# Patient Record
Sex: Male | Born: 1965 | Race: White | Hispanic: No
Health system: Southern US, Community
[De-identification: ages and names within clinical notes are randomized; demographics above are authoritative.]

## PROBLEM LIST (undated history)

## (undated) DIAGNOSIS — K269 Duodenal ulcer, unspecified as acute or chronic, without hemorrhage or perforation: Secondary | ICD-10-CM

## (undated) DIAGNOSIS — R0789 Other chest pain: Secondary | ICD-10-CM

## (undated) DIAGNOSIS — F419 Anxiety disorder, unspecified: Secondary | ICD-10-CM

## (undated) DIAGNOSIS — I10 Essential (primary) hypertension: Secondary | ICD-10-CM

## (undated) DIAGNOSIS — F4310 Post-traumatic stress disorder, unspecified: Secondary | ICD-10-CM

---

## 2013-08-01 DIAGNOSIS — A09 Infectious gastroenteritis and colitis, unspecified: Secondary | ICD-10-CM

## 2013-08-01 NOTE — ED Notes (Signed)
Pt given oral contrast.

## 2013-08-01 NOTE — ED Notes (Signed)
Bedside and Verbal shift change report given to Clydell HakimNoel T., RN (oncoming nurse) by Clarise CruzMarie A Clary, RN (offgoing nurse). Report included the following information SBAR, ED Summary and Recent Results.

## 2013-08-01 NOTE — ED Notes (Signed)
Pt laying in bed w/ visitor at bedside.

## 2013-08-01 NOTE — ED Notes (Addendum)
Patient present to ED c/o N/V/D and abdominal pain since Monday. Reports N/V/D. Recieves dialysis MWF. Got dialysis today. Bowel sound active. Left upper arm dialysis access has positive bruit and thrill. Patient lying in bed; wife at bedside.

## 2013-08-01 NOTE — ED Provider Notes (Signed)
Patient is a 48 y.o. male presenting with abdominal pain. The history is provided by the patient.   Abdominal Pain   This is a new problem. The current episode started 2 days ago. The problem occurs constantly. The problem has not changed since onset.The pain is associated with vomiting. The pain is located in the generalized abdominal region. Associated symptoms include diarrhea, nausea and vomiting. Past medical history comments: last dialysis yesterday.        Past Medical History   Diagnosis Date   ??? Chronic kidney disease      dialysis MWF   ??? Diabetes (HCC)         History reviewed. No pertinent past surgical history.      History reviewed. No pertinent family history.     History     Social History   ??? Marital Status: N/A     Spouse Name: N/A     Number of Children: N/A   ??? Years of Education: N/A     Occupational History   ??? Not on file.     Social History Main Topics   ??? Smoking status: Not on file   ??? Smokeless tobacco: Not on file   ??? Alcohol Use: Not on file   ??? Drug Use: Not on file   ??? Sexual Activity: Not on file     Other Topics Concern   ??? Not on file     Social History Narrative   ??? No narrative on file                  ALLERGIES: Review of patient's allergies indicates no known allergies.      Review of Systems   Gastrointestinal: Positive for nausea, vomiting, abdominal pain and diarrhea.   All other systems reviewed and are negative.      Filed Vitals:    08/01/13 2135   BP: 204/114   Pulse: 97   Temp: 98.6 ??F (37 ??C)   Resp: 18   Height: 5\' 7"  (1.702 m)   Weight: 77.111 kg (170 lb)   SpO2: 97%            Physical Exam   Constitutional: He is oriented to person, place, and time. He appears well-developed and well-nourished.   HENT:   Head: Normocephalic and atraumatic.   Mouth/Throat: Oropharynx is clear and moist. No oropharyngeal exudate.   Eyes: Conjunctivae and EOM are normal. Pupils are equal, round, and reactive to light. Right eye exhibits no discharge. Left eye exhibits no discharge. No  scleral icterus.   Neck: Normal range of motion. Neck supple. No thyromegaly present.   Cardiovascular: Normal rate, regular rhythm, normal heart sounds and intact distal pulses.    No murmur heard.  Pulmonary/Chest: Effort normal and breath sounds normal. No respiratory distress. He has no wheezes. He has no rales.   Abdominal: Soft. Bowel sounds are normal. He exhibits no distension. There is tenderness. There is no rebound and no guarding.   Epigastric and periumbilical tenderness   Musculoskeletal: He exhibits no edema.   Lymphadenopathy:     He has no cervical adenopathy.   Neurological: He is alert and oriented to person, place, and time.   Skin: Skin is warm. No rash noted. No erythema.   Nursing note and vitals reviewed.       MDM    Procedures

## 2013-08-02 ENCOUNTER — Inpatient Hospital Stay
Admit: 2013-08-02 | Discharge: 2013-08-04 | Disposition: A | Discharge status: Home / Self Care | Payer: MEDICARE | Attending: Internal Medicine | Admitting: Internal Medicine

## 2013-08-02 LAB — CBC WITH AUTOMATED DIFF
ABS. BASOPHILS: 0 10*3/uL (ref 0.0–0.1)
ABS. EOSINOPHILS: 0.3 10*3/uL (ref 0.0–0.4)
ABS. LYMPHOCYTES: 1.2 10*3/uL (ref 0.8–3.5)
ABS. MONOCYTES: 1 10*3/uL (ref 0.0–1.0)
ABS. NEUTROPHILS: 16 10*3/uL — ABNORMAL HIGH (ref 1.8–8.0)
BASOPHILS: 0 % (ref 0–1)
EOSINOPHILS: 2 % (ref 0–7)
HCT: 41.9 % (ref 36.6–50.3)
HGB: 14.3 g/dL (ref 12.1–17.0)
LYMPHOCYTES: 7 % — ABNORMAL LOW (ref 12–49)
MCH: 31.2 PG (ref 26.0–34.0)
MCHC: 34.1 g/dL (ref 30.0–36.5)
MCV: 91.3 FL (ref 80.0–99.0)
MONOCYTES: 6 % (ref 5–13)
NEUTROPHILS: 85 % — ABNORMAL HIGH (ref 32–75)
PLATELET: 128 10*3/uL — ABNORMAL LOW (ref 150–400)
RBC: 4.59 M/uL (ref 4.10–5.70)
RDW: 13.3 % (ref 11.5–14.5)
WBC: 18.5 10*3/uL — ABNORMAL HIGH (ref 4.1–11.1)

## 2013-08-02 LAB — METABOLIC PANEL, COMPREHENSIVE
A-G Ratio: 0.7 — ABNORMAL LOW (ref 1.1–2.2)
ALT (SGPT): 17 U/L (ref 12–78)
AST (SGOT): 15 U/L (ref 15–37)
Albumin: 3.4 g/dL — ABNORMAL LOW (ref 3.5–5.0)
Alk. phosphatase: 91 U/L (ref 45–117)
Anion gap: 19 mmol/L — ABNORMAL HIGH (ref 5–15)
BUN/Creatinine ratio: 4 — ABNORMAL LOW (ref 12–20)
BUN: 26 MG/DL — ABNORMAL HIGH (ref 6–20)
Bilirubin, total: 0.4 MG/DL (ref 0.2–1.0)
CO2: 25 mmol/L (ref 21–32)
Calcium: 8.5 MG/DL (ref 8.5–10.1)
Chloride: 95 mmol/L — ABNORMAL LOW (ref 97–108)
Creatinine: 6.87 MG/DL — ABNORMAL HIGH (ref 0.45–1.15)
GFR est AA: 11 mL/min/{1.73_m2} — ABNORMAL LOW (ref >60)
GFR est non-AA: 9 mL/min/{1.73_m2} — ABNORMAL LOW (ref >60)
Globulin: 4.6 g/dL — ABNORMAL HIGH (ref 2.0–4.0)
Glucose: 157 mg/dL — ABNORMAL HIGH (ref 65–100)
Potassium: 3.5 mmol/L (ref 3.5–5.1)
Protein, total: 8 g/dL (ref 6.4–8.2)
Sodium: 139 mmol/L (ref 136–145)

## 2013-08-02 LAB — METABOLIC PANEL, BASIC
Anion gap: 16 mmol/L — ABNORMAL HIGH (ref 5–15)
BUN/Creatinine ratio: 4 — ABNORMAL LOW (ref 12–20)
BUN: 32 MG/DL — ABNORMAL HIGH (ref 6–20)
CO2: 29 mmol/L (ref 21–32)
Calcium: 8.6 MG/DL (ref 8.5–10.1)
Chloride: 95 mmol/L — ABNORMAL LOW (ref 97–108)
Creatinine: 7.38 MG/DL — ABNORMAL HIGH (ref 0.45–1.15)
GFR est AA: 10 mL/min/{1.73_m2} — ABNORMAL LOW (ref >60)
GFR est non-AA: 8 mL/min/{1.73_m2} — ABNORMAL LOW (ref >60)
Glucose: 186 mg/dL — ABNORMAL HIGH (ref 65–100)
Potassium: 3.6 mmol/L (ref 3.5–5.1)
Sodium: 140 mmol/L (ref 136–145)

## 2013-08-02 LAB — CBC W/O DIFF
HCT: 42.5 % (ref 36.6–50.3)
HGB: 14.2 g/dL (ref 12.1–17.0)
MCH: 30.9 PG (ref 26.0–34.0)
MCHC: 33.4 g/dL (ref 30.0–36.5)
MCV: 92.4 FL (ref 80.0–99.0)
PLATELET: 218 10*3/uL (ref 150–400)
RBC: 4.6 M/uL (ref 4.10–5.70)
RDW: 13.3 % (ref 11.5–14.5)
WBC: 19.1 10*3/uL — ABNORMAL HIGH (ref 4.1–11.1)

## 2013-08-02 LAB — GLUCOSE, POC
Glucose (POC): 208 mg/dL — ABNORMAL HIGH (ref 65–100)
Glucose (POC): 128 mg/dL — ABNORMAL HIGH (ref 65–100)
Glucose (POC): 160 mg/dL — ABNORMAL HIGH (ref 65–100)

## 2013-08-02 LAB — POC CHEM8
Anion gap (POC): 19 mmol/L — ABNORMAL HIGH (ref 5–15)
BUN (POC): 37 MG/DL — ABNORMAL HIGH (ref 9–20)
CO2 (POC): 29 MMOL/L (ref 21–32)
Calcium, ionized (POC): 0.89 MMOL/L — ABNORMAL LOW (ref 1.12–1.32)
Chloride (POC): 95 MMOL/L — ABNORMAL LOW (ref 98–107)
Creatinine (POC): 7.6 MG/DL — ABNORMAL HIGH (ref 0.6–1.3)
GFRAA, POC: 9 mL/min/{1.73_m2} — ABNORMAL LOW (ref >60)
GFRNA, POC: 8 mL/min/{1.73_m2} — ABNORMAL LOW (ref >60)
Glucose (POC): 152 MG/DL — ABNORMAL HIGH (ref 75–110)
Hematocrit (POC): 46 % (ref 36.6–50.3)
Hemoglobin (POC): 15.6 GM/DL (ref 12.1–17.0)
Potassium (POC): 5 MMOL/L (ref 3.5–5.1)
Sodium (POC): 138 MMOL/L (ref 136–145)

## 2013-08-02 LAB — LIPASE: Lipase: 309 U/L (ref 73–393)

## 2013-08-02 MED ORDER — CIPROFLOXACIN IN D5W 400 MG/200 ML IV PIGGY BACK
400 mg/200 mL | Freq: Two times a day (BID) | INTRAVENOUS | Status: DC
Start: 2013-08-02 — End: 2013-08-04
  Administered 2013-08-02 – 2013-08-04 (×5): via INTRAVENOUS

## 2013-08-02 MED ORDER — GLUCAGON 1 MG INJECTION
1 mg | INTRAMUSCULAR | Status: DC | PRN
Start: 2013-08-02 — End: 2013-08-04

## 2013-08-02 MED ORDER — DIATRIZOATE MEGLUMINE & SODIUM 66 %-10 % ORAL SOLN
66-10 % | ORAL | Status: AC
Start: 2013-08-02 — End: 2013-08-01
  Administered 2013-08-02: 04:00:00 via ORAL

## 2013-08-02 MED ORDER — HYDRALAZINE 50 MG TAB
50 mg | Freq: Three times a day (TID) | ORAL | Status: DC
Start: 2013-08-02 — End: 2013-08-04
  Administered 2013-08-02 – 2013-08-04 (×6): via ORAL

## 2013-08-02 MED ORDER — METRONIDAZOLE IN SODIUM CHLORIDE (ISO-OSM) 500 MG/100 ML IV PIGGY BACK
500 mg/100 mL | Freq: Three times a day (TID) | INTRAVENOUS | Status: DC
Start: 2013-08-02 — End: 2013-08-04
  Administered 2013-08-02 – 2013-08-04 (×7): via INTRAVENOUS

## 2013-08-02 MED ORDER — GLUCOSE 4 GRAM CHEWABLE TAB
4 gram | ORAL | Status: DC | PRN
Start: 2013-08-02 — End: 2013-08-04

## 2013-08-02 MED ORDER — SODIUM CHLORIDE 0.9 % IJ SYRG
INTRAMUSCULAR | Status: DC | PRN
Start: 2013-08-02 — End: 2013-08-04

## 2013-08-02 MED ORDER — HYDROCODONE-ACETAMINOPHEN 5 MG-325 MG TAB
5-325 mg | ORAL | Status: DC | PRN
Start: 2013-08-02 — End: 2013-08-04
  Administered 2013-08-02 – 2013-08-04 (×5): via ORAL

## 2013-08-02 MED ORDER — DEXTROSE 50% IN WATER (D50W) IV SYRG
INTRAVENOUS | Status: DC | PRN
Start: 2013-08-02 — End: 2013-08-04

## 2013-08-02 MED ORDER — ONDANSETRON (PF) 4 MG/2 ML INJECTION
4 mg/2 mL | INTRAMUSCULAR | Status: DC | PRN
Start: 2013-08-02 — End: 2013-08-04
  Administered 2013-08-02: 20:00:00 via INTRAVENOUS

## 2013-08-02 MED ORDER — ACETAMINOPHEN 325 MG TABLET
325 mg | ORAL | Status: DC | PRN
Start: 2013-08-02 — End: 2013-08-04

## 2013-08-02 MED ORDER — HYDRALAZINE 20 MG/ML IJ SOLN
20 mg/mL | INTRAMUSCULAR | Status: AC
Start: 2013-08-02 — End: 2013-08-02
  Administered 2013-08-02: 07:00:00 via INTRAVENOUS

## 2013-08-02 MED ORDER — AMLODIPINE 5 MG TAB
5 mg | Freq: Every day | ORAL | Status: DC
Start: 2013-08-02 — End: 2013-08-04
  Administered 2013-08-02 – 2013-08-04 (×3): via ORAL

## 2013-08-02 MED ORDER — MORPHINE 2 MG/ML INJECTION
2 mg/mL | INTRAMUSCULAR | Status: AC
Start: 2013-08-02 — End: 2013-08-02
  Administered 2013-08-02: 07:00:00 via INTRAVENOUS

## 2013-08-02 MED ORDER — DIPHENHYDRAMINE 25 MG CAP
25 mg | ORAL | Status: DC | PRN
Start: 2013-08-02 — End: 2013-08-04
  Administered 2013-08-03 (×2): via ORAL

## 2013-08-02 MED ORDER — SODIUM CHLORIDE 0.9 % IJ SYRG
Freq: Three times a day (TID) | INTRAMUSCULAR | Status: DC
Start: 2013-08-02 — End: 2013-08-04
  Administered 2013-08-02 – 2013-08-04 (×8): via INTRAVENOUS

## 2013-08-02 MED ORDER — INSULIN REGULAR HUMAN 100 UNIT/ML INJECTION
100 unit/mL | Freq: Four times a day (QID) | INTRAMUSCULAR | Status: DC
Start: 2013-08-02 — End: 2013-08-04
  Administered 2013-08-02 – 2013-08-03 (×3): via SUBCUTANEOUS

## 2013-08-02 MED ORDER — SODIUM CHLORIDE 0.9 % IJ SYRG
Freq: Three times a day (TID) | INTRAMUSCULAR | Status: DC
Start: 2013-08-02 — End: 2013-08-04
  Administered 2013-08-02 – 2013-08-04 (×8): via INTRAVENOUS

## 2013-08-02 MED ORDER — ONDANSETRON (PF) 4 MG/2 ML INJECTION
4 mg/2 mL | INTRAMUSCULAR | Status: AC
Start: 2013-08-02 — End: 2013-08-02
  Administered 2013-08-02: 05:00:00 via INTRAVENOUS

## 2013-08-02 MED ORDER — ONDANSETRON 4 MG TAB, RAPID DISSOLVE
4 mg | ORAL | Status: AC
Start: 2013-08-02 — End: 2013-08-01
  Administered 2013-08-02: 02:00:00 via ORAL

## 2013-08-02 MED ORDER — SODIUM CHLORIDE 0.9 % IJ SYRG
INTRAMUSCULAR | Status: DC | PRN
Start: 2013-08-02 — End: 2013-08-04
  Administered 2013-08-04: 14:00:00 via INTRAVENOUS

## 2013-08-02 MED ADMIN — gabapentin (NEURONTIN) capsule 300 mg: ORAL | @ 15:00:00 | NDC 68084076211

## 2013-08-02 MED ADMIN — morphine injection 2 mg: INTRAVENOUS | @ 20:00:00 | NDC 00409189001

## 2013-08-02 MED ADMIN — pravastatin (PRAVACHOL) tablet 10 mg: ORAL | @ 14:00:00 | NDC 68084050011

## 2013-08-02 MED ADMIN — gabapentin (NEURONTIN) capsule 300 mg: ORAL | @ 20:00:00 | NDC 68084076211

## 2013-08-02 MED ADMIN — citalopram (CELEXA) tablet 10 mg: ORAL | @ 14:00:00 | NDC 00904608561

## 2013-08-02 MED ADMIN — carvedilol (COREG) tablet 25 mg: ORAL | @ 14:00:00 | NDC 00093729501

## 2013-08-02 MED ADMIN — pantoprazole (PROTONIX) 40 mg in sodium chloride 0.9 % 10 mL injection: INTRAVENOUS | @ 14:00:00 | NDC 00409488810

## 2013-08-02 MED ADMIN — carvedilol (COREG) tablet 25 mg: ORAL | @ 22:00:00 | NDC 00093729501

## 2013-08-02 MED FILL — GABAPENTIN 300 MG CAP: 300 mg | ORAL | Qty: 1

## 2013-08-02 MED FILL — HYDROCODONE-ACETAMINOPHEN 5 MG-325 MG TAB: 5-325 mg | ORAL | Qty: 1

## 2013-08-02 MED FILL — PRAVASTATIN 10 MG TAB: 10 mg | ORAL | Qty: 1

## 2013-08-02 MED FILL — PROTONIX 40 MG INTRAVENOUS SOLUTION: 40 mg | INTRAVENOUS | Qty: 40

## 2013-08-02 MED FILL — CITALOPRAM 20 MG TAB: 20 mg | ORAL | Qty: 1

## 2013-08-02 MED FILL — CIPROFLOXACIN IN D5W 400 MG/200 ML IV PIGGY BACK: 400 mg/200 mL | INTRAVENOUS | Qty: 200

## 2013-08-02 MED FILL — MD-GASTROVIEW 66 %-10 % ORAL SOLUTION: 66-10 % | ORAL | Qty: 30

## 2013-08-02 MED FILL — METRONIDAZOLE IN SODIUM CHLORIDE (ISO-OSM) 500 MG/100 ML IV PIGGY BACK: 500 mg/100 mL | INTRAVENOUS | Qty: 100

## 2013-08-02 MED FILL — HYDRALAZINE 20 MG/ML IJ SOLN: 20 mg/mL | INTRAMUSCULAR | Qty: 1

## 2013-08-02 MED FILL — CARVEDILOL 12.5 MG TAB: 12.5 mg | ORAL | Qty: 2

## 2013-08-02 MED FILL — HYDRALAZINE 50 MG TAB: 50 mg | ORAL | Qty: 2

## 2013-08-02 MED FILL — AMLODIPINE 5 MG TAB: 5 mg | ORAL | Qty: 2

## 2013-08-02 MED FILL — ONDANSETRON (PF) 4 MG/2 ML INJECTION: 4 mg/2 mL | INTRAMUSCULAR | Qty: 2

## 2013-08-02 MED FILL — NALOXONE 0.4 MG/ML INJECTION: 0.4 mg/mL | INTRAMUSCULAR | Qty: 1

## 2013-08-02 MED FILL — INSULIN REGULAR HUMAN 100 UNIT/ML INJECTION: 100 unit/mL | INTRAMUSCULAR | Qty: 1

## 2013-08-02 MED FILL — ONDANSETRON 4 MG TAB, RAPID DISSOLVE: 4 mg | ORAL | Qty: 1

## 2013-08-02 MED FILL — MORPHINE 2 MG/ML INJECTION: 2 mg/mL | INTRAMUSCULAR | Qty: 1

## 2013-08-02 MED FILL — RENAGEL 800 MG TABLET: 800 mg | ORAL | Qty: 1

## 2013-08-02 MED FILL — K-TAB 10 MEQ TABLET,EXTENDED RELEASE: 10 mEq | ORAL | Qty: 2

## 2013-08-02 MED FILL — NICOTINE 14 MG/24 HR DAILY PATCH: 14 mg/24 hr | TRANSDERMAL | Qty: 1

## 2013-08-02 MED FILL — BD POSIFLUSH NORMAL SALINE 0.9 % INJECTION SYRINGE: INTRAMUSCULAR | Qty: 10

## 2013-08-02 MED FILL — RENAGEL 400 MG TABLET: 400 mg | ORAL | Qty: 2

## 2013-08-02 NOTE — Consults (Signed)
Name:       Ricky Kelley, Ricky Kelley          Admitted:          08/01/2013                                         DOB:               28-May-1965  Account #:  [id number]               Age:               [age]  Consultant: Kelisha Dall A. Inocencio HomesGayle, MD       Location                                    CONSULTATION REPORT    DATE OF CONSULTATION           08/02/2013      PRIMARY CARE PHYSICIAN: Dr. Basilia Jumboovington.    ATTENDING PHYSICIAN: Dr. Maretta BeesBhola    REASON FOR CONSULTATION:     Assistance with management of this patient with a history of end-stage renal disease on a background of possible colitis.        PRESENTING COMPLAINT:     Abdominal pain.        HISTORY OF PRESENT ILLNESS:       The patient is a 48 year old gentleman whose comorbidities include end-stage renal disease and hypertension. [facility] dialyzes [facility] [place] [facility]. His primary nephrologist is Dr. Elmon Elsean Carl. The patient's usual schedule is a Monday/ Wednesday/ Friday schedule.    Over the last several days, the patient has experienced episodes of abdominal pain. It was epigastric in location and  nonradiating. This was accompanied by multiple episodes of nausea and vomiting. The contents were  said to be dark in color. There was no suggestion of hematemesis. He also reported diarrhoea. There was no suggestion of hematochezia. He denied taking aspirin or NSAIDs. There  were  no other family members with similar symptoms.       In light of the above symptoms, he presented to the ER for an evaluation. The imaging studies on 08/02/2013 suggested the possibility of colitis. There was diffuse  thickening of the descending and transverse colon with  stranding of the pericolonic fat.            PAST MEDICAL HISTORY:        1. End-stage renal disease. He has been on dialysis for the past 10 months.    2. Diabetes mellitus diagnosed in 1990.  3. Diabetic retinopathy.  4. Hypertension diagnosed in 1990.        ALLERGIES:       NO KNOWN DRUG ALLERGIES.      MEDICATIONS  PRIOR TO ADMISSION      1. Rocaltrol orally.  2. Norvasc 10 mg daily.  3. Coreg 25 mg twice daily.  4. Celexa 10 mg daily.  5. Neurontin 300 mg 3 times daily.  6. Hydralazine 100 mg p.o., frequency unclear.  7. Lantus SoloSTAR.  8. Oxycodone IR.  9. Roxicodone 5 mg, immediate release.  10. Pravachol 10 mg daily.  11. Renvela/Renagel 800 mg 3 times a day with meals.      Since admission :    1.  Protonix 40 mg IV q.12h.  2. Metronidazole 5 mg IV q.8h.    FAMILY HISTORY:     His great uncle is on dialysis therapy .        SOCIAL HISTORY:     He has smoked for approximately 10 years (1 1/2 packs per day).  He has a fiance.          REVIEW OF SYSTEMS      CARDIOVASCULAR: No history of chest pain.      RESPIRATORY: No history of shortness of breath.      GI: Addition to the above, he has a history of weight loss and anorexia.      RENAL: Still makes urine. No history of dysuria or hematuria.    ENDOCRINE: No history of thyroid disease.    RHEUMATOLOGIC: No history of SLE.      DERMATOLOGIC: No history of skin rash.      ENT: No history of sinusitis.      ONCOLOGIC: No history of cancer.      VASCULAR: AV fistula placement.          PHYSICAL EXAMINATION      GENERAL:     A middle-aged male patient lying in bed. He was in  mild discomfort.     VITAL SIGNS:     BP  168/82, pulse 73, temperature 98 degrees Fahrenheit, respirations 19, saturation 97%. Weight was 77.1 kilos.   ( BP - 230/120 - earlier).    HEENT: Mucous membranes were  moist.     NECK: No JVD elevation. No obvious masses. No carotid bruit.      CHEST:     He had a tattoo on the left side of chest.     CARDIAC:     First and second sounds are present. No S3 gallop or pericardial  rub.    RESPIRATORY:     Breath sounds are vesicular. No wheezing, no rales or  rhonchi.      ABDOMEN:     Not distended. Mild epigastric tenderness is noted, No guarding, Bowel sounds present.     LOWER EXTREMITIES:     No pretibial edema.     No obvious dorsal ulcers.     Left arm -  left upper extremity AV shunt . It was well-formed. There was no erythema nor bleeding. A  bruit was appreciated.    MUSCULOSKELETAL:     Muscle bulk appeared appropriate for his age.      CENTRAL NERVOUS SYSTEM:     He was awake, alert and answering questions appropriately.        LABORATORY DATA:         04/09, sodium was 140, potassium 3.6, chloride 95, bicarbonate 29, glucose 2.86, BUN 32, creatinine 7.38, calcium 8.6, albumin 3.4, globulin 4.6, ALT 17, AST 15, alkaline phosphatase 91, lipase 309, normal.       CBC: Hemoglobin 14.2, WBC count 9.0, platelet count 280.    CT of the abdomen and pelvis without contrast shows findings consistent  with colitis involving the descending and transverse colon.          IMPRESSION    1. Colitis.  2. End-stage renal disease.  3. Hypertension.  4. Diabetes mellitus.  5. Hypoalbuminemia.        DISCUSSION AND PLAN:       The patient will be dialyzed tomorrow, Friday, 04/10.     The patient's hemoglobin is above 11. I would not start Epogen  at  This time.      Dose all antibiotics for stage VI CKD.    I would consider adjusting the dose / frequency of Neurontin  given the presence of ESRD.    Thank you for this consultation.       Our team will follow .           Reviewed on 08/02/2013 6:09 PM                Shweta Aman A. Inocencio Homes, MD    cc:                       Monika Salk, MD                            Sunnie Nielsen. Baldo Ash, MD                            Fay Records, MD                            Sajad Glander A. Inocencio Homes, MD      RAG/wmx; Kelley: 08/02/2013 04:22 P; T: 08/02/2013 05:40 P; DOC# [id number]; Job#  [id number]

## 2013-08-02 NOTE — ED Notes (Signed)
Pt resting in room w/ visitor at bedside.

## 2013-08-02 NOTE — Progress Notes (Signed)
Se[facility] and [facility] (in [facility]). A Nephrology Consult was dictated as well  (Job ID # [id number]).  [place] [phone](# [phone]) was contacted this afternoon as well.      Plan      Dialysis on Friday (4/10).      Thanks for the consult.    Our team will follow.

## 2013-08-02 NOTE — Progress Notes (Signed)
Bedside and Verbal shift change report given to Mamta (oncoming nurse) by Sarah (offgoing nurse). Report included the following information SBAR, Kardex, MAR and Recent Results.

## 2013-08-02 NOTE — H&P (Signed)
Pt Name  Ricky Kelley   Date of Birth [birth year]   Medical Record Number  [id number]      Age  48 y.o.   PCP Fay Records, MD   Admit date:  08/01/2013    Room Number  226/01  @ [facility]   Date of Service  08/02/2013    Chart reviewed   Pt seen and examined       Admission Diagnoses:  Colitis     We are admitting Ricky Kelley 48 y.o. male with a principle diagnosis of Colitis; this patient also suffers from other comorbidities listed below.     Assessment and plan:    1)Infectious Colitis  -continue Cipro/ Metronidazole  -stool culture, Hep panel (given his tattoos and dialysis status)      2)R/O Drug abuse  -UDS    3)ESRD  -continuing Dialysis inpt, nephrology consulted    ED: WCC 18/ K 3.5 / K 7.38  CT abdomen Colitis      Functional Status     CODE STATUS  Full   Surrogate decision maker: Pt is competent     Prophylaxis  Lovenox   Discharge Plan: Home w/Family,    There are currently no Active Isolations Payor: [place] MEDICARE / Plan: VA MEDICARE PART A & B / Product Type: Medicare /    Social issues  Date Comment           Prognosis  good        PC: Stomach pains and vomiting    History of Present Illness :  Ricky Kelley has been having some stomach pains and vomiting since Monday. He describes this after having some Cordon bleu chicken. The pain is vague, generalized, now graded at 5/10 severity. He has been having assoc n and V, but no diarrhea or fever.  He did dialysis on Monday and on Tuesday, but it had been getting worse and he decided to come to hospital  He denies any sick contacts    N/V/abd pain since Monday, had cordon bleu   Past Medical History   Diagnosis Date   ??? Chronic kidney disease      dialysis MWF   ??? Diabetes (HCC)    ??? HTN (hypertension)        History reviewed. No pertinent past surgical history.    History   Substance Use Topics   ??? Smoking status: Not on file   ??? Smokeless tobacco: Not on file   ??? Alcohol Use: Not on file       Family History   Problem Relation Age of  Onset   ??? Heart Attack Other    ??? Diabetes Other    ??? Hypertension Mother        Review of Systems:  (negative unless bold)    Gen:  Weight gain, weight loss, fever, chills, fatigue  Eyes:  Visual changes, pain, conjunctivitis  ENT:  Sore throat, rhinorrhea, decreased hearing  CVS:  Palpitations, chest pain, dizziness, syncope, edema, PND  Pulm:  Cough, dyspnea, sputum, hemoptysis, wheezing  GI:  Abdominal pain, nausea, emesis, diarrhea, constipation, GERD, melena  GU:  Hematuria, incontinence, nocturia, dysuria, discharge  MS:  Pain, weakness, swelling, arthritis  Skin:  Rash, erythema, abscess, wound, moles  Psych:  Insomnia, depression, anxiety, crying, suicidal ideation  Endo:  Heat intolerance, cold intolerance, weight gain, polyuria  Hem:  Enlarged nodes, bruising, bleeding, night sweats  Renal:  Edema, change in urine, flank pain  Neuro:  Numbness, tingling, weakness, seizure, headache, tremors          Physical Exam:    Gen:  Well-developed, well-nourished, in no acute distress  HEENT:  Pink conjunctivae, PERRL, hearing intact to voice, moist mucous membranes  Neck:  Supple, without masses, thyroid non-tender  Resp:  No accessory muscle use, clear breath sounds without wheezes rales or rhonchi  Card:  No murmurs, normal S1, S2 without thrills, bruits or peripheral edema  Abd:  Soft, non-tender, non-distended, normoactive bowel sounds are present, no palpable organomegaly  Lymph:  No cervical adenopathy  Musc:  No cyanosis or clubbing  Skin:  No rashes or ulcers, skin turgor is good  Neuro:  Cranial nerves 3-12 are grossly intact, grip strength is 5/5 bilaterally, dorsi / plantarflexion strength is 5/5 bilaterally, follows commands appropriately  Psych:  Alert with good insight.  Oriented to person, place, and time    Home Meds     Prior to Admission medications    Medication Sig Start Date End Date Taking? Authorizing Provider   amLODIPine (NORVASC) 10 mg tablet  05/31/13   Phys Other, MD   [facility]  strip  07/25/13   Phys Other, MD   carvedilol (COREG) 25 mg tablet  05/31/13   Phys Other, MD   citalopram (CELEXA) 10 mg tablet  06/21/13   Phys Other, MD   gabapentin (NEURONTIN) 300 mg capsule  05/31/13   Phys Other, MD   hydrALAZINE (APRESOLINE) 100 mg tablet  05/31/13   Phys Other, MD   LANTUS SOLOSTAR 100 unit/mL (3 mL) pen  06/08/13   Phys Other, MD   oxyCODONE IR (ROXICODONE) 5 mg immediate release tablet  05/08/13   Phys Other, MD   pravastatin (PRAVACHOL) 10 mg tablet  05/31/13   Phys Other, MD   RENVELA 800 mg tab tab  06/15/13   Phys Other, MD   RENAGEL 800 mg tablet  05/04/13   Phys Other, MD   zolpidem (AMBIEN) 10 mg tablet  06/14/13   Phys Other, MD       Relevant other informations:     Other medical conditions listed in this following active hospital problem list section; all of these and other pertinent data were taken into consideration when treatment plan is developed and customized to this patient's unique overall circumstances and needs.   Patient Active Problem List    Diagnosis Date Noted   ??? ESRD (end stage renal disease) (HCC) 08/02/2013   ??? Colitis 08/02/2013   ??? HTN (hypertension) 08/02/2013        Data Review:   Recent Days:  Recent Labs      08/02/13   0400  08/01/13   2208   WBC  19.1*  18.5*   HGB  14.2  14.3   HCT  42.5  41.9   PLT  218  128*     Recent Labs      08/02/13   0400  08/01/13   2230   NA  140  139   K  3.6  3.5   CL  95*  95*   CO2  29  25   GLU  186*  157*   BUN  32*  26*   CREA  7.38*  6.87*   CA  8.6  8.5   ALB   --   3.4*   TBILI   --   0.4   SGOT   --   15  ALT   --   17     No results found for this basename: tsh     ______________________________________________________________________________________________________    Medications reviewed     Current Facility-Administered Medications   Medication Dose Route Frequency   ??? amLODIPine (NORVASC) tablet 10 mg  10 mg Oral DAILY   ??? carvedilol (COREG) tablet 25 mg  25 mg Oral BID WITH MEALS   ??? citalopram (CELEXA) tablet 10 mg  10 mg  Oral DAILY   ??? gabapentin (NEURONTIN) capsule 300 mg  300 mg Oral TID   ??? hydrALAZINE (APRESOLINE) tablet 100 mg  100 mg Oral Q8H   ??? pravastatin (PRAVACHOL) tablet 10 mg  10 mg Oral DAILY   ??? sevelamer (RENAGEL) tablet 800 mg  800 mg Oral TID WITH MEALS   ??? sodium chloride (NS) flush 5-10 mL  5-10 mL IntraVENous Q8H   ??? sodium chloride (NS) flush 5-10 mL  5-10 mL IntraVENous PRN   ??? acetaminophen (TYLENOL) tablet 650 mg  650 mg Oral Q4H PRN   ??? HYDROcodone-acetaminophen (NORCO) 5-325 mg per tablet 1 Tab  1 Tab Oral Q4H PRN   ??? acetaminophen (TYLENOL) tablet 650 mg  650 mg Oral Q4H PRN   ??? diphenhydrAMINE (BENADRYL) capsule 25 mg  25 mg Oral Q4H PRN   ??? ondansetron (ZOFRAN) injection 4 mg  4 mg IntraVENous Q4H PRN   ??? nicotine (NICODERM CQ) 14 mg/24 hr patch 1 Patch  1 Patch TransDERmal DAILY PRN   ??? morphine injection 2 mg  2 mg IntraVENous Q6H PRN   ??? naloxone (NARCAN) injection 0.4 mg  0.4 mg IntraVENous PRN   ??? insulin regular (NOVOLIN R, HUMULIN R) injection   SubCUTAneous Q6H   ??? glucose chewable tablet 16 g  4 Tab Oral PRN   ??? dextrose (D50W) injection syrg 12.5-25 g  12.5-25 g IntraVENous PRN   ??? glucagon (GLUCAGEN) injection 1 mg  1 mg IntraMUSCular PRN   ??? metroNIDAZOLE (FLAGYL) IVPB premix 500 mg  500 mg IntraVENous Q8H   ??? ciprofloxacin (CIPRO) 400 mg IVPB (premix)  400 mg IntraVENous Q12H   ??? pantoprazole (PROTONIX) 40 mg in sodium chloride 0.9 % 10 mL injection  40 mg IntraVENous Q12H   ??? [START ON 08/03/2013] doxercalciferol (HECTOROL) 4 mcg/2 mL injection 1 mcg  1 mcg IntraVENous DIALYSIS MON, WED & FRI   ??? sodium chloride (NS) flush 5-10 mL  5-10 mL IntraVENous Q8H   ??? sodium chloride (NS) flush 5-10 mL  5-10 mL IntraVENous PRN       _________________________________________________________________________________  Care Plan discussed with: Patient/Family  ______________________________________________________________________________________________________    High complexity decision making was  performed for this patient  Today total floor/unit time was 40 minutes while caring for this patient and greater than 50% of that time was spent with patient (and/or family) discussing patient???s clinical issues.    ________________________________________________________________________  Monika Salk MD                            08/02/2013 6:54 PM   ________________________________________________________________________

## 2013-08-02 NOTE — ED Notes (Signed)
Pt. Discussed with Dr. Lee

## 2013-08-02 NOTE — ED Notes (Signed)
TRANSFER - OUT REPORT:    Verbal report given to Nida BoatmanBrad, Charity fundraiserN (name) on Virl SonRonald D Sandquist  being transferred to Shaaron AdlerNoel M Townes, RN (unit) for routine progression of care       Report consisted of patient???s Situation, Background, Assessment and   Recommendations(SBAR).     Information from the following report(s) SBAR, ED Summary, [facility] and Recent Results was reviewed with the receiving nurse.    Opportunity for questions and clarification was provided.

## 2013-08-02 NOTE — ED Notes (Signed)
Provider at bedside.

## 2013-08-02 NOTE — Progress Notes (Signed)
08/02/13    0630 Admitted for colitis. Sleepy. 6am BG 208 3 units reg ins administered. NPO. NO IV/B/P left arm d/t dialysis site. Last Dialyzed wed. Nephrology to be consulted. Thompson CaulBrad Bennett, RN

## 2013-08-02 NOTE — H&P (Signed)
History & Physical Dictation Template      NAME: Ricky Kelley   Date of Service  08/02/2013   MRN  [id number]   Date of Birth 1965/06/16   AGE: 48 y.o.   ROOM: 226/01     @HACCTNO @    @HAREXTID @     @HARGUARSSN @     HPI  The patient Ricky Kelley is a 48 y.o. male that is admitted with abdominal pain/diarrhea.  The patient has a pmh significant for esrd on hd mwf, htn. The patient begain to have some symptoms on Monday after dialysis with n/v followed by liquid diarrhea.  The patient has associated symptoms of abdominal pain in which he rates 10/10, as well as chills and fever.  The patient was not able to take the pain any longer and came to ed for further eval and treamtent.  He had a ct scan done showing colitis.   THE PATIENT WAS SEEN VIA REMOTE PRESENCE WITH RN PRESENT.    ASSESSMENT/ PLAN   1. Colitis: npo, will start abx therapy with iv cipro/flagyl, pain control, check stool for occult blood, ? Blood   2. Esrd: will consult nephrology  3. Htn: cont home meds   Principal Problem:    Colitis (08/02/2013)    Active Problems:    ESRD (end stage renal disease) ([place]) (08/02/2013)      HTN (hypertension) (08/02/2013)           Chart reviewed   Pt seen and examined   Please see full dictation for additional        BMI: Body mass index is 26.62 kg/(m^2).  Visit Vitals   Item Reading   ??? BP 155/84   ??? Pulse 93   ??? Temp 97.9 ??F (36.6 ??C)   ??? Resp 18   ??? Ht 5' 7"  (1.702 m)   ??? Wt 77.111 kg (170 lb)   ??? BMI 26.62 kg/m2   ??? SpO2 96%       Pertinent Physical Exam Findings: abd soft, no guarding, heart sounds regular, lungs clear  ________________________________________________________________________  family history is not on file.  ________________________________________________________________________  Present on Admission:   ??? ESRD (end stage renal disease) ([place])  ??? Colitis  ??? HTN (hypertension)  ________________________________________________________________________   ________________________________________________________________________  Recent Results (from the past 24 hour(s))   CBC WITH AUTOMATED DIFF    Collection Time     08/01/13 10:08 PM       Result Value Ref Range    WBC 18.5 (*) 4.1 - 11.1 K/uL    RBC 4.59  4.10 - 5.70 M/uL    HGB 14.3  12.1 - 17.0 g/dL    HCT 41.9  36.6 - 50.3 %    MCV 91.3  80.0 - 99.0 FL    MCH 31.2  26.0 - 34.0 PG    MCHC 34.1  30.0 - 36.5 g/dL    RDW 13.3  11.5 - 14.5 %    PLATELET 128 (*) 150 - 400 K/uL    NEUTROPHILS 85 (*) 32 - 75 %    LYMPHOCYTES 7 (*) 12 - 49 %    MONOCYTES 6  5 - 13 %    EOSINOPHILS 2  0 - 7 %    BASOPHILS 0  0 - 1 %    ABS. NEUTROPHILS 16.0 (*) 1.8 - 8.0 K/UL    ABS. LYMPHOCYTES 1.2  0.8 - 3.5 K/UL    ABS. MONOCYTES 1.0  0.0 - 1.0  K/UL    ABS. EOSINOPHILS 0.3  0.0 - 0.4 K/UL    ABS. BASOPHILS 0.0  0.0 - 0.1 K/UL   POC CHEM8    Collection Time     08/01/13 10:16 PM       Result Value Ref Range    Calcium, ionized (POC) 0.89 (*) 1.12 - 1.32 MMOL/L    Sodium (POC) 138  136 - 145 MMOL/L    Potassium (POC) 5.0  3.5 - 5.1 MMOL/L    Chloride (POC) 95 (*) 98 - 107 MMOL/L    CO2 (POC) 29  21 - 32 MMOL/L    Anion gap (POC) 19 (*) 5 - 15 mmol/L    Glucose (POC) 152 (*) 75 - 110 MG/DL    BUN (POC) 37 (*) 9 - 20 MG/DL    Creatinine (POC) 7.6 (*) 0.6 - 1.3 MG/DL    GFR-AA (POC) 9 (*) >60 ml/min/1.57m    GFR, non-AA (POC) 8 (*) >60 ml/min/1.779m   Hemoglobin (POC) 15.6  12.1 - 17.0 GM/DL    Hematocrit (POC) 46  36.6 - 50.3 %    Comment Comment Not Indicated.     METABOLIC PANEL, COMPREHENSIVE    Collection Time     08/01/13 10:30 PM       Result Value Ref Range    Sodium 139  136 - 145 mmol/L    Potassium 3.5  3.5 - 5.1 mmol/L    Chloride 95 (*) 97 - 108 mmol/L    CO2 25  21 - 32 mmol/L    Anion gap 19 (*) 5 - 15 mmol/L    Glucose 157 (*) 65 - 100 mg/dL    BUN 26 (*) 6 - 20 MG/DL    Creatinine 6.87 (*) 0.45 - 1.15 MG/DL    BUN/Creatinine ratio 4 (*) 12 - 20      GFR est AA 11 (*) >60 ml/min/1.737m  GFR est non-AA 9 (*) >60  ml/min/1.47m45m Calcium 8.5  8.5 - 10.1 MG/DL    Bilirubin, total 0.4  0.2 - 1.0 MG/DL    ALT 17  12 - 78 U/L    AST 15  15 - 37 U/L    Alk. phosphatase 91  45 - 117 U/L    Protein, total 8.0  6.4 - 8.2 g/dL    Albumin 3.4 (*) 3.5 - 5.0 g/dL    Globulin 4.6 (*) 2.0 - 4.0 g/dL    A-G Ratio 0.7 (*) 1.1 - 2.2     LIPASE    Collection Time     08/01/13 10:30 PM       Result Value Ref Range    Lipase 309  73 - 393 U/L   CBC W/O DIFF    Collection Time     08/02/13  4:00 AM       Result Value Ref Range    WBC 19.1 (*) 4.1 - 11.1 K/uL    RBC 4.60  4.10 - 5.70 M/uL    HGB 14.2  12.1 - 17.0 g/dL    HCT 42.5  36.6 - 50.3 %    MCV 92.4  80.0 - 99.0 FL    MCH 30.9  26.0 - 34.0 PG    MCHC 33.4  30.0 - 36.5 g/dL    RDW 13.3  11.5 - 14.5 %    PLATELET 218  150 - 400 213L   METABOLIC PANEL, BASIC    Collection Time  08/02/13  4:00 AM       Result Value Ref Range    Sodium 140  136 - 145 mmol/L    Potassium 3.6  3.5 - 5.1 mmol/L    Chloride 95 (*) 97 - 108 mmol/L    CO2 29  21 - 32 mmol/L    Anion gap 16 (*) 5 - 15 mmol/L    Glucose 186 (*) 65 - 100 mg/dL    BUN 32 (*) 6 - 20 MG/DL    Creatinine 7.38 (*) 0.45 - 1.15 MG/DL    BUN/Creatinine ratio 4 (*) 12 - 20      GFR est AA 10 (*) >60 ml/min/1.29m    GFR est non-AA 8 (*) >60 ml/min/1.734m   Calcium 8.6  8.5 - 10.1 MG/DL     ________________________________________________________________________  Medications reviewed  Current Facility-Administered Medications   Medication Dose Route Frequency   ??? amLODIPine (NORVASC) tablet 10 mg  10 mg Oral DAILY   ??? carvedilol (COREG) tablet 25 mg  25 mg Oral BID WITH MEALS   ??? citalopram (CELEXA) tablet 10 mg  10 mg Oral DAILY   ??? gabapentin (NEURONTIN) capsule 300 mg  300 mg Oral TID   ??? hydrALAZINE (APRESOLINE) tablet 100 mg  100 mg Oral Q8H   ??? pravastatin (PRAVACHOL) tablet 10 mg  10 mg Oral DAILY   ??? sevelamer (RENAGEL) tablet 800 mg  800 mg Oral TID WITH MEALS   ??? sodium chloride (NS) flush 5-10 mL  5-10 mL IntraVENous Q8H   ??? sodium  chloride (NS) flush 5-10 mL  5-10 mL IntraVENous PRN   ??? acetaminophen (TYLENOL) tablet 650 mg  650 mg Oral Q4H PRN   ??? HYDROcodone-acetaminophen (NORCO) 5-325 mg per tablet 1 Tab  1 Tab Oral Q4H PRN   ??? acetaminophen (TYLENOL) tablet 650 mg  650 mg Oral Q4H PRN   ??? diphenhydrAMINE (BENADRYL) capsule 25 mg  25 mg Oral Q4H PRN   ??? ondansetron (ZOFRAN) injection 4 mg  4 mg IntraVENous Q4H PRN   ??? nicotine (NICODERM CQ) 14 mg/24 hr patch 1 Patch  1 Patch TransDERmal DAILY PRN   ??? morphine injection 2 mg  2 mg IntraVENous Q6H PRN   ??? naloxone (NARCAN) injection 0.4 mg  0.4 mg IntraVENous PRN   ??? insulin regular (NOVOLIN R, HUMULIN R) injection   SubCUTAneous Q6H   ??? glucose chewable tablet 16 g  4 Tab Oral PRN   ??? dextrose (D50W) injection syrg 12.5-25 g  12.5-25 g IntraVENous PRN   ??? glucagon (GLUCAGEN) injection 1 mg  1 mg IntraMUSCular PRN   ??? metroNIDAZOLE (FLAGYL) IVPB premix 500 mg  500 mg IntraVENous Q8H   ??? ciprofloxacin (CIPRO) 400 mg IVPB (premix)  400 mg IntraVENous Q12H   ??? pantoprazole (PROTONIX) 40 mg in sodium chloride 0.9 % 10 mL injection  40 mg IntraVENous Q12H   ??? sodium chloride (NS) flush 5-10 mL  5-10 mL IntraVENous Q8H   ??? sodium chloride (NS) flush 5-10 mL  5-10 mL IntraVENous PRN       JaWindle GuardMD  08/02/2013  5:40 AM

## 2013-08-02 NOTE — Progress Notes (Signed)
[id number] Report received @ bedside from [facility]

## 2013-08-02 NOTE — ED Notes (Signed)
Pt has only taken a sip of oral contrast. Pt vomited in room. Provider notified.

## 2013-08-02 NOTE — Progress Notes (Signed)
Reviewed chart. Assessment in progress. Detail note to follow.

## 2013-08-03 LAB — CBC WITH AUTOMATED DIFF
ABS. BASOPHILS: 0 10*3/uL (ref 0.0–0.1)
ABS. EOSINOPHILS: 0.5 10*3/uL — ABNORMAL HIGH (ref 0.0–0.4)
ABS. LYMPHOCYTES: 2.2 10*3/uL (ref 0.8–3.5)
ABS. MONOCYTES: 1.3 10*3/uL — ABNORMAL HIGH (ref 0.0–1.0)
ABS. NEUTROPHILS: 12.5 10*3/uL — ABNORMAL HIGH (ref 1.8–8.0)
BASOPHILS: 0 % (ref 0–1)
EOSINOPHILS: 3 % (ref 0–7)
HCT: 39.4 % (ref 36.6–50.3)
HGB: 13 g/dL (ref 12.1–17.0)
LYMPHOCYTES: 13 % (ref 12–49)
MCH: 31.2 PG (ref 26.0–34.0)
MCHC: 33 g/dL (ref 30.0–36.5)
MCV: 94.5 FL (ref 80.0–99.0)
MONOCYTES: 8 % (ref 5–13)
NEUTROPHILS: 76 % — ABNORMAL HIGH (ref 32–75)
PLATELET: 231 10*3/uL (ref 150–400)
RBC: 4.17 M/uL (ref 4.10–5.70)
RDW: 13.4 % (ref 11.5–14.5)
WBC: 16.5 10*3/uL — ABNORMAL HIGH (ref 4.1–11.1)

## 2013-08-03 LAB — URINALYSIS W/ REFLEX CULTURE
Blood: NEGATIVE
Glucose: 250 mg/dL — AB
Leukocyte Esterase: NEGATIVE
Nitrites: NEGATIVE
Protein: >300 mg/dL — AB
Specific gravity: 1.025 (ref 1.003–1.030)
Urobilinogen: 0.2 EU/dL (ref 0.2–1.0)
pH (UA): 6.5 (ref 5.0–8.0)

## 2013-08-03 LAB — HEPATIC FUNCTION PANEL
A-G Ratio: 0.7 — ABNORMAL LOW (ref 1.1–2.2)
ALT (SGPT): 15 U/L (ref 12–78)
AST (SGOT): 10 U/L — ABNORMAL LOW (ref 15–37)
Albumin: 3.1 g/dL — ABNORMAL LOW (ref 3.5–5.0)
Alk. phosphatase: 88 U/L (ref 45–117)
Bilirubin, direct: 0.1 MG/DL (ref 0.0–0.2)
Bilirubin, total: 0.3 MG/DL (ref 0.2–1.0)
Globulin: 4.5 g/dL — ABNORMAL HIGH (ref 2.0–4.0)
Protein, total: 7.6 g/dL (ref 6.4–8.2)

## 2013-08-03 LAB — BILIRUBIN, CONFIRM: Bilirubin UA, confirm: NEGATIVE

## 2013-08-03 LAB — METABOLIC PANEL, BASIC
Anion gap: 15 mmol/L (ref 5–15)
BUN/Creatinine ratio: 5 — ABNORMAL LOW (ref 12–20)
BUN: 46 MG/DL — ABNORMAL HIGH (ref 6–20)
CO2: 30 mmol/L (ref 21–32)
Calcium: 7.8 MG/DL — ABNORMAL LOW (ref 8.5–10.1)
Chloride: 94 mmol/L — ABNORMAL LOW (ref 97–108)
Creatinine: 10.02 MG/DL — ABNORMAL HIGH (ref 0.45–1.15)
GFR est AA: 7 mL/min/{1.73_m2} — ABNORMAL LOW (ref >60)
GFR est non-AA: 6 mL/min/{1.73_m2} — ABNORMAL LOW (ref >60)
Glucose: 127 mg/dL — ABNORMAL HIGH (ref 65–100)
Potassium: 3.5 mmol/L (ref 3.5–5.1)
Sodium: 139 mmol/L (ref 136–145)

## 2013-08-03 LAB — DRUG SCREEN, URINE
AMPHETAMINES: NEGATIVE
BARBITURATES: NEGATIVE
BENZODIAZEPINES: NEGATIVE
COCAINE: POSITIVE — AB
METHADONE: NEGATIVE
OPIATES: POSITIVE — AB
PCP(PHENCYCLIDINE): NEGATIVE
THC (TH-CANNABINOL): NEGATIVE

## 2013-08-03 LAB — GLUCOSE, POC
Glucose (POC): 102 mg/dL — ABNORMAL HIGH (ref 65–100)
Glucose (POC): 114 mg/dL — ABNORMAL HIGH (ref 65–100)
Glucose (POC): 88 mg/dL (ref 65–100)
Glucose (POC): 184 mg/dL — ABNORMAL HIGH (ref 65–100)

## 2013-08-03 MED ADMIN — sevelamer (RENAGEL) tablet 800 mg: ORAL | @ 22:00:00 | NDC 58468002001

## 2013-08-03 MED ADMIN — citalopram (CELEXA) tablet 10 mg: ORAL | @ 18:00:00 | NDC 00904608561

## 2013-08-03 MED ADMIN — pantoprazole (PROTONIX) 40 mg in sodium chloride 0.9 % 10 mL injection: INTRAVENOUS | NDC 00409488810

## 2013-08-03 MED ADMIN — pantoprazole (PROTONIX) 40 mg in sodium chloride 0.9 % 10 mL injection: INTRAVENOUS | @ 18:00:00 | NDC 00409488810

## 2013-08-03 MED ADMIN — pravastatin (PRAVACHOL) tablet 10 mg: ORAL | @ 18:00:00 | NDC 68084050011

## 2013-08-03 MED ADMIN — carvedilol (COREG) tablet 25 mg: ORAL | @ 18:00:00 | NDC 00093729501

## 2013-08-03 MED ADMIN — potassium chloride SR (KLOR-CON 10) tablet 20 mEq: ORAL | NDC 68084052411

## 2013-08-03 MED ADMIN — doxercalciferol (HECTOROL) 4 mcg/2 mL injection 1 mcg: INTRAVENOUS | @ 17:00:00 | NDC 58468012301

## 2013-08-03 MED ADMIN — gabapentin (NEURONTIN) capsule 300 mg: ORAL | @ 02:00:00 | NDC 68084008011

## 2013-08-03 MED ADMIN — sevelamer (RENAGEL) tablet 800 mg: ORAL | @ 18:00:00 | NDC 58468002001

## 2013-08-03 MED ADMIN — gabapentin (NEURONTIN) capsule 300 mg: ORAL | @ 18:00:00 | NDC 68084076211

## 2013-08-03 MED ADMIN — gabapentin (NEURONTIN) capsule 300 mg: ORAL | @ 22:00:00 | NDC 68084076211

## 2013-08-03 MED FILL — METRONIDAZOLE IN SODIUM CHLORIDE (ISO-OSM) 500 MG/100 ML IV PIGGY BACK: 500 mg/100 mL | INTRAVENOUS | Qty: 100

## 2013-08-03 MED FILL — BD POSIFLUSH NORMAL SALINE 0.9 % INJECTION SYRINGE: INTRAMUSCULAR | Qty: 10

## 2013-08-03 MED FILL — PRAVASTATIN 10 MG TAB: 10 mg | ORAL | Qty: 1

## 2013-08-03 MED FILL — DIPHENHYDRAMINE 25 MG CAP: 25 mg | ORAL | Qty: 1

## 2013-08-03 MED FILL — HYDROCODONE-ACETAMINOPHEN 5 MG-325 MG TAB: 5-325 mg | ORAL | Qty: 1

## 2013-08-03 MED FILL — INSULIN REGULAR HUMAN 100 UNIT/ML INJECTION: 100 unit/mL | INTRAMUSCULAR | Qty: 1

## 2013-08-03 MED FILL — RENAGEL 400 MG TABLET: 400 mg | ORAL | Qty: 2

## 2013-08-03 MED FILL — HYDRALAZINE 50 MG TAB: 50 mg | ORAL | Qty: 2

## 2013-08-03 MED FILL — DOXERCALCIFEROL 4 MCG/2 ML IV SOLN: 4 mcg/2 mL | INTRAVENOUS | Qty: 2

## 2013-08-03 MED FILL — GABAPENTIN 300 MG CAP: 300 mg | ORAL | Qty: 1

## 2013-08-03 MED FILL — PROTONIX 40 MG INTRAVENOUS SOLUTION: 40 mg | INTRAVENOUS | Qty: 40

## 2013-08-03 MED FILL — CARVEDILOL 12.5 MG TAB: 12.5 mg | ORAL | Qty: 2

## 2013-08-03 MED FILL — AMLODIPINE 5 MG TAB: 5 mg | ORAL | Qty: 2

## 2013-08-03 MED FILL — CIPROFLOXACIN IN D5W 400 MG/200 ML IV PIGGY BACK: 400 mg/200 mL | INTRAVENOUS | Qty: 200

## 2013-08-03 MED FILL — CITALOPRAM 20 MG TAB: 20 mg | ORAL | Qty: 1

## 2013-08-03 NOTE — Other (Signed)
DTC Progress Note    Recommendations/ Comments: Chart reviewed secondary to POC BG.  Noted patient has Lantus insulin listed in PTA meds and One Touch Ultra meter at home.  Contacted Dr. Fortino Sicovington's office to clarify patietn has history of diabetes on Lantus insulin 18 units daily. He was last seen by his PCP on 06/18/2013 and his A1c was 8.4%.    Please consider with current renal status changing correction scale insulin to lispro to avoid stacking of the regular insulin which has onger duration of action.    Chart reviewed on [name].    Patient is a 48 y.o. male with History Type 2 Diabetes on insulin injections: Lantus : 18 units at home.    A1c: 8.4% on 06/18/2013 in PCP office  No results found for this basename: HBA1C, HGBE8       Recent Glucose Results: Lab Results   Component Value Date/Time    GLU 127* 08/03/2013  3:20 AM    GLUCPOC 114* 08/03/2013  5:05 AM    GLUCPOC 102* 08/02/2013 11:43 PM    GLUCPOC 160* 08/02/2013  5:27 PM        Lab Results   Component Value Date/Time    Creatinine 10.02 08/03/2013  3:20 AM       Active Orders   Diet    DIET NPO        PO intake: Patient Vitals for the past 72 hrs:   % Diet Eaten   08/02/13 0642 0 %       Current hospital DM medication: correction scale regular insulin     Will continue to follow as needed.    Thank you.    Ricky Kelley, RD,CDE [phone]  [facility]

## 2013-08-03 NOTE — Procedures (Signed)
[facility] Team        Vitals  Pre  Post  Assessment  Pre  Post    BP   149/90 144/68 LOC  Alert and oriented x 3 Alert and oriented x 3   HR  70 76 Lungs  CTA CTA   Temp  98.7 98.7 Cardiac  WNL WNL   Resp  18 18 Skin  Warm Warm   Weight   Edema None None   Lowest Treatment Bp   Pain  Denies Denies     Orders    Duration:  Start:  0910 End:  1315 Total:  4 hrs   Dialyzer:  F-180   K Bath:  3k+   Ca Bath:  2 ca   Na / Bicarb:  140/35   Target Fluid Removal:  1 kg     Access    Type & Location:  Left upper arm AVF. Cannulated with 15 gauge needles x 2 w/o difficulty. Good bruit and thrill felt.   Comments: Post treatment, held the sites until hemostasis achieved, site covered with guaze and dressing.     Labs    Obtained/Reviewed   Critical Results Called  Noted and reviewed      Meds Given    Name  Dose  Route    Hectorol 1 mcg  IV given               Total Liters Process:  90.1 L   Net Fluid Removed:  1 kg     Comments    0910: Tx initiated as ordered. Hep B panel drawn yesterday.  1130: Pt watching TV, vital signs stable.  1315: All possible blood given back post HD. Pt tolerated HD well. Report given to primary nurse.

## 2013-08-03 NOTE — Progress Notes (Signed)
Bedside and Verbal shift change report given to Jennifer (oncoming nurse) by Sarah (offgoing nurse). Report included the following information SBAR, Kardex, MAR and Recent Results.

## 2013-08-03 NOTE — Progress Notes (Signed)
Bedside and Verbal shift change report given to Monique RN (oncoming nurse) by myself(offgoing nurse). Report included the following information SBAR, Kardex, Intake/Output, MAR, Accordion, Recent Results and Med Rec Status.

## 2013-08-03 NOTE — Progress Notes (Signed)
Problem: Patient Education: Go to Patient Education Activity  Goal: Patient/Family Education  Outcome: Progressing Towards Goal  Pt tolerating HD well. Will continue to monitor labs.

## 2013-08-03 NOTE — Progress Notes (Addendum)
Care Management Interventions  PCP Visit Scheduled by CM: Yes  Palliative Care Consult: No  Advanced Directive and POA/Guardian:  (none reported, )  Care Management Consult: Yes  MyChart Signup: No  Discharge Durable Medical Equipment: No (pt reported using a cane. )  Physical Therapy Consult: No  Occupational Therapy Consult: No  Speech Therapy Consult: No  Adequate Support Network: Lives with Spouse  Confirm Follow Up Transport: Family (pt reported that his mother will transport him. )  Plan discussed with patient?: Yes  Pt Discharge Location  Living Situation: Home    Readmission Risk Assessment:     Low Risk and MSSP/Good Help ACO patients    RRAT Score:  1 - 9    Initial Assessment:  Cm met with pt for initial assessment and discharge coordination. Pt reported living with his significant other. Pt denied stairs in the residence. Pt reported using a cane and denied other mobility issues. Pt's UDS is positive for opiates and cocaine. However, pt denied SA. Pt stated that he is unaware of reason for positive result. Pt denied MH. Pt reported his income to be SSDI.     Pt confirmed VA Medicare.     Cm spoke with Jenny Reichmann, clinic manager, at [facility] and was informed that pt comes MWF consistently.      Emergency Contact:   Thurman Coyer [phone], pt's significant other.     Pertinent Medical Hx:         PCP/Specialists: Dr. Ahmed Prima.       Community Services:   None reported.     DME:   Cane.       Low Risk Care Transition Plan:  1. Evaluate for [facility] or H2H, community care coordination of resources  Pt denied HH.   2. Involve patient/caregiver in assessment, planning, education and implement of intervention.  Cm will continue to involve pt in assessment.   3. CM daily patient care huddles/interdisciplinary rounds. Cm will discuss with MD and RN.   4. PCP/Specialist appointment within 7 - 10 days made prior to discharge. CM arranged an appointment with Dr. Chyrl Civatte April 30 at 1:30 PM-  earliest appointment available because Dr. Chyrl Civatte is out of the office several weeks.   5. Facilitate transportation and logistics for follow-up appointments. Pt reported that he walks to dialysis appointments(right down the street) and that he is transported to appointments by his mother if necessary.   6. Handoff to [facility] or PCP practice.  -Birdie Hopes, MSW  [phone]

## 2013-08-03 NOTE — Progress Notes (Signed)
[facility]     Renal Daily Progress Note:     Admission Date: 08/01/2013     Subjective:  Feels ok, no abd pain, tools now formed without diarrhea or abd cramping, no fever/ chills    Review of Systems  Pertinent items are noted in HPI.    Objective:     BP 108/65    Pulse 76    Temp(Src) 98.2 ??F (36.8 ??C) (Oral)    Resp 20    Ht 5\' 7"  (1.702 m)    Wt 77.111 kg (170 lb)    BMI 26.62 kg/m2      SpO2 96%   Temp (24hrs), Avg:98.3 ??F (36.8 ??C), Min:97.5 ??F (36.4 ??C), Max:98.7 ??F (37.1 ??C)        Intake/Output Summary (Last 24 hours) at 08/03/13 1832  Last data filed at 08/03/13 1616   Gross per 24 hour   Intake   1050 ml   Output   1000 ml   Net     50 ml       Physical Exam:General appearance: alert, cooperative, no distress, appears stated age  Neck: supple, symmetrical, trachea midline, no adenopathy and no JVD  Lungs: clear to auscultation bilaterally  Heart: regular rate and rhythm, no S3 or S4  Abdomen: soft, non-tender. Bowel sounds normal. No masses,  no organomegaly  Extremities: no edema    Data Review:     LABS:  Recent Labs      08/03/13   0320  08/02/13   0400  08/01/13   2230   NA  139  140  139   K  3.5  3.6  3.5   CL  94*  95*  95*   CO2  30  29  25    BUN  46*  32*  26*   CREA  10.02*  7.38*  6.87*   CA  7.8*  8.6  8.5   ALB  3.1*   --   3.4*     Recent Labs      08/03/13   0320  08/02/13   0400  08/01/13   2208   WBC  16.5*  19.1*  18.5*   HGB  13.0  14.2  14.3   HCT  39.4  42.5  41.9   PLT  231  218  128*     No results for input(s): NAU, KU, CLU, CREAU in the last 72 hours.    Invalid input(s): PROU    Assessment:   Renal Specific Problems  1. Colitis.  2. End-stage renal disease.  3. Hypertension.  4. Diabetes mellitus.  5. Hypoalbuminemia.        Plan:     Obtain/ Order: labs/cultures/radiology/procedures:  none    Therapeutic:    Dialysis today, repeat Monday. We will be available over weekend if needed    Total time spent with patient:      Dala DockSandy Sabeen Piechocki, MD    [phone]

## 2013-08-03 NOTE — Progress Notes (Signed)
Hospitalist Progress Note   Patient Name  Ricky Kelley   Admit date:  08/01/2013   Medical Record Number  [id number]    Age  48 y.o.   Date of Birth January 06, 1966   PCP Fay Records, MD    Room Number  226/01 [facility]     Admission Diagnoses:  Colitis     Assessment/ Plan:     1)Infectious Colitis  -doing a lot better today clinically  -WCC slowly downtrending 19->16  -continuing Cipro/ Metronidazole  -stool culture, Hep panel (given his tattoos and dialysis status)      2)R/O Drug abuse  -UDS pos for Cocaine/Opiates, even though he denies it  -agreed to screen for HIV    3)ESRD  -continuing Dialysis inpt, nephrology consulted  -K 3.5    ED: [facility] 18/ K 3.5 / K 7.38  CT abdomen Colitis  Principal Problem:    Colitis (08/02/2013)    Active Problems:    ESRD (end stage renal disease) ([facility]) (08/02/2013)      HTN (hypertension) (08/02/2013)      Cocaine abuse (08/03/2013)      Body mass index is 26.62 kg/(m^2).    Functional Status  good   CODE STATUS  Full   Surrogate decision maker: Pt is competent     Prophylaxis  Lovenox   Discharge Plan: Home w/Family,       Payor: VA MEDICARE / Plan: VA MEDICARE PART A & B / Product Type: Medicare /    Query No queries noted (chunky button not showing         Social issues  Updates  Date Comment           Prognosis         Subjective:   Feeling much better, hungry, tolerating crackers.  Minimal abd pain.      Objective:     Physical Exam:    Gen:  Well-developed, well-nourished, in no acute distress  HEENT:  Pink conjunctivae, PERRL, hearing intact to voice, moist mucous membranes  Neck:  Supple, without masses, thyroid non-tender  Resp:  No accessory muscle use, clear breath sounds without wheezes rales or rhonchi  Card:  No murmurs, normal S1, S2 without thrills, bruits or peripheral edema  Abd:  Soft, non-tender, non-distended, normoactive bowel sounds are present, no palpable organomegaly  Lymph:  No cervical adenopathy  Musc:  No cyanosis or clubbing  Skin:  No  rashes or ulcers, skin turgor is good  Neuro:  Cranial nerves 3-12 are grossly intact, grip strength is 5/5 bilaterally, dorsi / plantarflexion strength is 5/5 bilaterally, follows commands appropriately  Psych:  Alert with good insight.  Oriented to person, place, and time            Intake/Output Summary (Last 24 hours) at 08/03/13 1212  Last data filed at 08/02/13 1541   Gross per 24 hour   Intake    200 ml   Output      0 ml   Net    200 ml    Weight change:      Wt Readings from Last 10 Encounters:   08/01/13 77.111 kg (170 lb)        Relevant informations:     Other medical conditions listed in this following active hospital problem list section; all of these and other pertinent data were taken into consideration when treatment plan is developed and customized to this patient's unique overall circumstances and needs.  Patient Active Problem List    Diagnosis Date Noted   ??? ESRD (end stage renal disease) ([facility]) 08/02/2013   ??? Colitis 08/02/2013   ??? HTN (hypertension) 08/02/2013          Data Review:   Recent Days:  Recent Labs      08/03/13   0320  08/02/13   0400  08/01/13   2208   WBC  16.5*  19.1*  18.5*   HGB  13.0  14.2  14.3   HCT  39.4  42.5  41.9   PLT  231  218  128*     Recent Labs      08/03/13   0320  08/02/13   0400  08/01/13   2230   NA  139  140  139   K  3.5  3.6  3.5   CL  94*  95*  95*   CO2  30  29  25    GLU  127*  186*  157*   BUN  46*  32*  26*   CREA  10.02*  7.38*  6.87*   CA  7.8*  8.6  8.5   ALB  3.1*   --   3.4*   TBILI  0.3   --   0.4   SGOT  10*   --   15   ALT  15   --   17     No results found for this basename: tsh     ______________________________________________________________________________________________________    Medications reviewed     Current Facility-Administered Medications   Medication Dose Route Frequency   ??? amLODIPine (NORVASC) tablet 10 mg  10 mg Oral DAILY   ??? carvedilol (COREG) tablet 25 mg  25 mg Oral BID WITH MEALS   ??? citalopram (CELEXA) tablet 10 mg  10  mg Oral DAILY   ??? gabapentin (NEURONTIN) capsule 300 mg  300 mg Oral TID   ??? hydrALAZINE (APRESOLINE) tablet 100 mg  100 mg Oral Q8H   ??? pravastatin (PRAVACHOL) tablet 10 mg  10 mg Oral DAILY   ??? sevelamer (RENAGEL) tablet 800 mg  800 mg Oral TID WITH MEALS   ??? sodium chloride (NS) flush 5-10 mL  5-10 mL IntraVENous Q8H   ??? sodium chloride (NS) flush 5-10 mL  5-10 mL IntraVENous PRN   ??? acetaminophen (TYLENOL) tablet 650 mg  650 mg Oral Q4H PRN   ??? HYDROcodone-acetaminophen (NORCO) 5-325 mg per tablet 1 Tab  1 Tab Oral Q4H PRN   ??? acetaminophen (TYLENOL) tablet 650 mg  650 mg Oral Q4H PRN   ??? diphenhydrAMINE (BENADRYL) capsule 25 mg  25 mg Oral Q4H PRN   ??? ondansetron (ZOFRAN) injection 4 mg  4 mg IntraVENous Q4H PRN   ??? nicotine (NICODERM CQ) 14 mg/24 hr patch 1 Patch  1 Patch TransDERmal DAILY PRN   ??? morphine injection 2 mg  2 mg IntraVENous Q6H PRN   ??? naloxone (NARCAN) injection 0.4 mg  0.4 mg IntraVENous PRN   ??? insulin regular (NOVOLIN R, HUMULIN R) injection   SubCUTAneous Q6H   ??? glucose chewable tablet 16 g  4 Tab Oral PRN   ??? dextrose (D50W) injection syrg 12.5-25 g  12.5-25 g IntraVENous PRN   ??? glucagon (GLUCAGEN) injection 1 mg  1 mg IntraMUSCular PRN   ??? metroNIDAZOLE (FLAGYL) IVPB premix 500 mg  500 mg IntraVENous Q8H   ??? ciprofloxacin (CIPRO) 400 mg IVPB (premix)  400 mg  IntraVENous Q12H   ??? pantoprazole (PROTONIX) 40 mg in sodium chloride 0.9 % 10 mL injection  40 mg IntraVENous Q12H   ??? doxercalciferol (HECTOROL) 4 mcg/2 mL injection 1 mcg  1 mcg IntraVENous DIALYSIS MON, WED & FRI   ??? sodium chloride (NS) flush 5-10 mL  5-10 mL IntraVENous Q8H   ??? sodium chloride (NS) flush 5-10 mL  5-10 mL IntraVENous PRN       ______________________________________________________________________________________________________  Care Plan discussed with: Patient/Family  ____________________________________________________________________________________________________    High complexity decision making was  performed for this patient.  Today total floor/unit time was 20 minutes while caring for this patient and greater than 50% of that time was spent with patient (and/or family) discussing patient???s clinical issues.  ______________________________________________________________________________________________________  Monika SalkVijai Ketara Cavness MD                      08/03/2013

## 2013-08-03 NOTE — Procedures (Signed)
[facility] Team        Vitals  Pre  Post  Assessment  Pre  Post    BP   149/90 144/68 LOC  Alert and oriented x 3 Alert and oriented x 3   HR  70 76 Lungs  CTA CTA   Temp  98.7 98.7 Cardiac  WNL WNL   Resp  18 18 Skin  Warm Warm   Weight   Edema None None   Lowest Treatment Bp   Pain  Denies Denies     Orders    Duration:  Start:  0910 End:  1315 Total:  4 hrs   Dialyzer:  F-180   K Bath:  3k+   Ca Bath:  2 ca   Na / Bicarb:  140/35   Target Fluid Removal:  1 kg     Access    Type & Location:  Left upper arm AVF. Cannulated with 15 gauge needles x 2 w/o difficulty. Good bruit and thrill felt.   Comments: Post treatment, held the sites until hemostasis achieved, site covered with guaze and dressing.     Labs    Obtained/Reviewed   Critical Results Called  Noted and reviewed      Meds Given    Name  Dose  Route    Hectorol 1 mcg  IV given               Total Liters Process:  90.1 L   Net Fluid Removed:  1 kg     Comments    0910: Tx initiated as ordered. Hep B panel drawn yesterday.  1130: Pt watching TV, vital signs stable.  1315: All possible blood given back post HD. Pt tolerated HD well. Report given to primary nurse.

## 2013-08-04 LAB — CBC WITH AUTOMATED DIFF
ABS. BASOPHILS: 0.1 10*3/uL (ref 0.0–0.1)
ABS. EOSINOPHILS: 0.7 10*3/uL — ABNORMAL HIGH (ref 0.0–0.4)
ABS. LYMPHOCYTES: 2.2 10*3/uL (ref 0.8–3.5)
ABS. MONOCYTES: 1.3 10*3/uL — ABNORMAL HIGH (ref 0.0–1.0)
ABS. NEUTROPHILS: 10.1 10*3/uL — ABNORMAL HIGH (ref 1.8–8.0)
BASOPHILS: 0 % (ref 0–1)
EOSINOPHILS: 5 % (ref 0–7)
HCT: 41.9 % (ref 36.6–50.3)
HGB: 13.6 g/dL (ref 12.1–17.0)
LYMPHOCYTES: 15 % (ref 12–49)
MCH: 30.8 PG (ref 26.0–34.0)
MCHC: 32.5 g/dL (ref 30.0–36.5)
MCV: 94.8 FL (ref 80.0–99.0)
MONOCYTES: 9 % (ref 5–13)
NEUTROPHILS: 71 % (ref 32–75)
PLATELET: 185 10*3/uL (ref 150–400)
RBC: 4.42 M/uL (ref 4.10–5.70)
RDW: 13.4 % (ref 11.5–14.5)
WBC: 14.3 10*3/uL — ABNORMAL HIGH (ref 4.1–11.1)

## 2013-08-04 LAB — GLUCOSE, POC
Glucose (POC): 130 mg/dL — ABNORMAL HIGH (ref 65–100)
Glucose (POC): 149 mg/dL — ABNORMAL HIGH (ref 65–100)

## 2013-08-04 LAB — METABOLIC PANEL, BASIC
Anion gap: 11 mmol/L (ref 5–15)
BUN/Creatinine ratio: 3 — ABNORMAL LOW (ref 12–20)
BUN: 23 MG/DL — ABNORMAL HIGH (ref 6–20)
CO2: 29 mmol/L (ref 21–32)
Calcium: 7.7 MG/DL — ABNORMAL LOW (ref 8.5–10.1)
Chloride: 99 mmol/L (ref 97–108)
Creatinine: 7.15 MG/DL — ABNORMAL HIGH (ref 0.45–1.15)
GFR est AA: 10 mL/min/{1.73_m2} — ABNORMAL LOW (ref >60)
GFR est non-AA: 8 mL/min/{1.73_m2} — ABNORMAL LOW (ref >60)
Glucose: 138 mg/dL — ABNORMAL HIGH (ref 65–100)
Potassium: 3.8 mmol/L (ref 3.5–5.1)
Sodium: 139 mmol/L (ref 136–145)

## 2013-08-04 LAB — HEPATITIS PANEL, ACUTE
Hep B Core Ab, IgM: NEGATIVE
Hep B surface Ag screen: NEGATIVE
Hep C Virus Ab: <0.1 s/co ratio (ref 0.0–0.9)
Hepatitis A Ab, IgM: NEGATIVE

## 2013-08-04 LAB — CULTURE, URINE
Colonies Counted: <1000
Colony Count: <1000
Culture result:: NO GROWTH
Culture: NO GROWTH

## 2013-08-04 MED ORDER — METRONIDAZOLE 500 MG TAB
500 mg | ORAL_TABLET | Freq: Three times a day (TID) | ORAL | Status: AC
Start: 2013-08-04 — End: 2013-08-09

## 2013-08-04 MED ORDER — CIPROFLOXACIN 500 MG TAB
500 mg | ORAL_TABLET | Freq: Two times a day (BID) | ORAL | Status: AC
Start: 2013-08-04 — End: 2013-08-11

## 2013-08-04 MED ADMIN — pantoprazole (PROTONIX) 40 mg in sodium chloride 0.9 % 10 mL injection: INTRAVENOUS | @ 14:00:00 | NDC 00409488810

## 2013-08-04 MED ADMIN — carvedilol (COREG) tablet 25 mg: ORAL | @ 13:00:00 | NDC 00093729501

## 2013-08-04 MED ADMIN — sevelamer (RENAGEL) tablet 800 mg: ORAL | @ 13:00:00 | NDC 58468002001

## 2013-08-04 MED ADMIN — pantoprazole (PROTONIX) 40 mg in sodium chloride 0.9 % 10 mL injection: INTRAVENOUS | NDC 00409488810

## 2013-08-04 MED ADMIN — carvedilol (COREG) tablet 25 mg: ORAL | NDC 00093729501

## 2013-08-04 MED ADMIN — gabapentin (NEURONTIN) capsule 300 mg: ORAL | @ 14:00:00 | NDC 68084076211

## 2013-08-04 MED ADMIN — gabapentin (NEURONTIN) capsule 300 mg: ORAL | @ 02:00:00 | NDC 68084076211

## 2013-08-04 MED ADMIN — citalopram (CELEXA) tablet 10 mg: ORAL | @ 14:00:00 | NDC 00904608561

## 2013-08-04 MED ADMIN — pravastatin (PRAVACHOL) tablet 10 mg: ORAL | @ 14:00:00 | NDC 68084050011

## 2013-08-04 MED FILL — PROTONIX 40 MG INTRAVENOUS SOLUTION: 40 mg | INTRAVENOUS | Qty: 40

## 2013-08-04 MED FILL — METRONIDAZOLE IN SODIUM CHLORIDE (ISO-OSM) 500 MG/100 ML IV PIGGY BACK: 500 mg/100 mL | INTRAVENOUS | Qty: 100

## 2013-08-04 MED FILL — HYDRALAZINE 50 MG TAB: 50 mg | ORAL | Qty: 2

## 2013-08-04 MED FILL — CIPROFLOXACIN IN D5W 400 MG/200 ML IV PIGGY BACK: 400 mg/200 mL | INTRAVENOUS | Qty: 200

## 2013-08-04 MED FILL — BD POSIFLUSH NORMAL SALINE 0.9 % INJECTION SYRINGE: INTRAMUSCULAR | Qty: 10

## 2013-08-04 MED FILL — RENAGEL 400 MG TABLET: 400 mg | ORAL | Qty: 2

## 2013-08-04 MED FILL — GABAPENTIN 300 MG CAP: 300 mg | ORAL | Qty: 1

## 2013-08-04 MED FILL — CITALOPRAM 20 MG TAB: 20 mg | ORAL | Qty: 1

## 2013-08-04 MED FILL — AMLODIPINE 5 MG TAB: 5 mg | ORAL | Qty: 2

## 2013-08-04 MED FILL — CARVEDILOL 12.5 MG TAB: 12.5 mg | ORAL | Qty: 2

## 2013-08-04 MED FILL — HYDROCODONE-ACETAMINOPHEN 5 MG-325 MG TAB: 5-325 mg | ORAL | Qty: 1

## 2013-08-04 NOTE — Progress Notes (Signed)
Discharge Reassessment Plan:  Low Risk    RRAT Score:  1 - 9     Low Risk Care Transition Interventions:  1. Discharge transition plan: Patient discharged home to address listed on face sheet.   2. Involved patient/caregiver in assessment, planning, education and implement of intervention. Yes   3. CM daily patient care huddles/interdisciplinary rounds were completed. yes  4. PCP/Specialist appointment within 5 days made prior to discharge.  Date/Time CM arranged an appointment with Dr. Basilia Jumboovington April 30 at 1:30 PM- earliest appointment available because Dr. Basilia Jumboovington is out of the office several weeks.   5. Facilitated transportation and logistics for follow-up appointments. Family will provided transportation.  6. Handoff to [facility] Nurse Navigator or PCP practice.    Care Management Interventions  PCP Visit Scheduled by CM: Yes  Palliative Care Consult: No  Advanced Directive and POA/Guardian:  (none reported, )  Care Management Consult: Yes  MyChart Signup: No  Discharge Durable Medical Equipment: No  Physical Therapy Consult: No  Occupational Therapy Consult: No  Speech Therapy Consult: No  Adequate Support Network: Lives with Spouse  Confirm Follow Up Transport: Family  Plan discussed with patient?: Yes  Pt Discharge Location  Living Situation: Home

## 2013-08-04 NOTE — Discharge Summary (Signed)
Physician Discharge Summary     Pt Name  Ricky Kelley   Admit date:  08/01/2013;08/04/2013   Discharge date and time:  08/04/2013   Room Number  226/01    Medical Record Number  [id number] @ [facility]   Age  48 y.o.   Date of Birth Jan 07, 1966   PCP Fay Records, MD     Admission Diagnoses:                        Colitis   Hospital Problems Never Reviewed        ICD-9-CM Class Noted POA     Cocaine abuse 305.60  08/03/2013 Unknown         ESRD (end stage renal disease) (HCC) 585.6  08/02/2013 Yes         *Colitis 558.9  08/02/2013 Yes         HTN (hypertension) 401.9  08/02/2013 Yes               No Known Allergies     Excerpt from HPI : Ricky Kelley has been having some stomach pains and vomiting since Monday. He describes this after having some Cordon bleu chicken. The pain is vague, generalized, now graded at 5/10 severity. He has been having assoc n and V, but no diarrhea or fever.  He did dialysis on Monday and on Tuesday, but it had been getting worse and he decided to come to hospital  He denies any sick contacts    N/V/abd pain since Monday, had cordon bleu        Hospital Course: This pt was admitted with  Colitis on 08/01/2013.     1)Infectious Colitis  -doing a lot better today clinically  -WCC slowly downtrending 19->16-> 14  -continuing Cipro/ Metronidazole  -stool culture, Hep panel (given his tattoos and dialysis status)      2)R/O Drug abuse  -UDS pos for Cocaine/Opiates, even though he denies it  -agreed to screen for HIV    3)ESRD  -continuing Dialysis inpt, nephrology consulted  -K 3.8    ED: [facility] 18/ K 3.5 / K 7.38  CT abdomen Colitis  LATER: Hep screen negative     Condition at the time of discharge improved and stable .     Query:   Treatment team:: Treatment Team: Attending Provider: Monika Salk, MD; Consulting Provider: Monika Salk, MD; Consulting Provider: Coral Spikes, MD; Care Manager: Robyne Peers, BSW       Other Pertinent data:   TODAY's CLINICAL FINDINGS:   Visit Vitals    Item Reading   ??? BP 133/77   ??? Pulse 78   ??? Temp(Src) 98 ??F (36.7 ??C) (Oral)   ??? Resp 20   ??? Ht 5\' 7"  (1.702 m)   ??? Wt 77.111 kg (170 lb)   ??? BMI 26.62 kg/m2   ??? SpO2 97%      Wt Readings from Last 10 Encounters:   08/01/13 77.111 kg (170 lb)       Physical Exam:    Gen: Well-developed, well-nourished, in no acute distress  HEENT: Pink conjunctivae, PERRL, hearing intact to voice, moist mucous membranes  Neck: Supple, without masses, thyroid non-tender  Resp: No accessory muscle use, clear breath sounds without wheezes rales or rhonchi  Card: No murmurs, normal S1, S2 without thrills, bruits or peripheral edema  Abd: Soft, non-tender, non-distended, normoactive bowel sounds are present, no palpable organomegaly  Lymph:  No cervical adenopathy  Musc: No cyanosis or clubbing  Skin: No rashes or ulcers, skin turgor is good  Neuro: Cranial nerves 3-12 are grossly intact, grip strength is 5/5 bilaterally, dorsi / plantarflexion strength is 5/5 bilaterally, follows commands appropriately  Psych: Alert with good insight. Oriented to person, place, and time       Disposition:Home.   Diet: Renal Diet   Care Plan discussed with: Patient/Family   Follow up   Follow-up Information    Follow up With Details Comments Contact Info    Fay Records, MD On 08/23/2013 at 1:30 PM.  [address] [address]  [phone]           Current Discharge Medication List      START taking these medications    Details   ciprofloxacin HCl (CIPRO) 500 mg tablet Take 1 Tab by mouth two (2) times a day for 7 days.  Qty: 14 Tab, Refills: 0      metroNIDAZOLE (FLAGYL) 500 mg tablet Take 1 Tab by mouth three (3) times daily for 5 days.  Qty: 15 Tab, Refills: 0         CONTINUE these medications which have NOT CHANGED    Details   amLODIPine (NORVASC) 10 mg tablet Refills: 5      ONETOUCH ULTRA TEST strip Refills: 0      carvedilol (COREG) 25 mg tablet Refills: 5      citalopram (CELEXA) 10 mg tablet Refills: 1      gabapentin  (NEURONTIN) 300 mg capsule Refills: 6      hydrALAZINE (APRESOLINE) 100 mg tablet Refills: 0      LANTUS SOLOSTAR 100 unit/mL (3 mL) pen Refills: 6      oxyCODONE IR (ROXICODONE) 5 mg immediate release tablet Refills: 0      pravastatin (PRAVACHOL) 10 mg tablet Refills: 10      RENVELA 800 mg tab tab Refills: 4      RENAGEL 800 mg tablet Refills: 4      zolpidem (AMBIEN) 10 mg tablet Refills: 4                Significant Diagnostic Studies:   Recent Labs      08/04/13   0326  08/03/13   0320   WBC  14.3*  16.5*   HGB  13.6  13.0   HCT  41.9  39.4   PLT  185  231     Recent Labs      08/04/13   0326  08/03/13   0320  08/02/13   0400   NA  139  139  140   K  3.8  3.5  3.6   CL  99  94*  95*   CO2  29  30  29    BUN  23*  46*  32*   CREA  7.15*  10.02*  7.38*   GLU  138*  127*  186*   CA  7.7*  7.8*  8.6     Recent Labs      08/03/13   0320  08/01/13   2230   SGOT  10*  15   ALT  15  17   AP  88  91   TBILI  0.3  0.4   TP  7.6  8.0   ALB  3.1*  3.4*   GLOB  4.5*  4.6*   LPSE   --   309  No results for input(s): INR, PTP, APTT in the last 72 hours.   No results for input(s): FE, TIBC, PSAT, FERR in the last 72 hours.   No results for input(s): PH, PCO2, PO2 in the last 72 hours.  No results for input(s): CPK, CKMB in the last 72 hours.    Invalid input(s): TROPONINI  Lab Results   Component Value Date/Time    Glucose (POC) 149 08/04/2013  6:31 AM    Glucose (POC) 130 08/04/2013  3:21 AM    Glucose (POC) 184 08/03/2013  5:46 PM    Glucose (POC) 88 08/03/2013 11:30 AM    Glucose (POC) 114 08/03/2013  5:05 AM    Glucose (POC) 152 08/01/2013 10:16 PM                 ________________________________________________________________________    Doylene Canard[Total Time for face to face meeting with the pt and examination including care coordination with staff and chart review (>50% of total time listed here )  approx. 20 min]    _________________________________________________________________________  Monika SalkVijai Trevan Messman, MD  08/04/2013

## 2013-08-06 LAB — HIV 1/2 AB SCREEN W RFLX CONFIRM
HIV 1/2 Interpretation: NONREACTIVE
HIV1/2 INTERPRETATION, HHIVI: NONREACTIVE

## 2013-08-06 NOTE — Telephone Encounter (Signed)
Verified name/DOB. Pt aware of all follow up appts and has all meds. No further issues.    Kara Weiland, MSW  [phone]

## 2013-08-08 LAB — CULTURE, BLOOD: Culture result:: NO GROWTH

## 2014-11-20 NOTE — H&P (Signed)
G I Procedure Note           Endoscopy History and Physical               Dr. Despina Arias Office [phone]    [place] Office  [phone]       Ricky Kelley [id number]  [id number]    [birth year]  49 y.o.  male      Date of Procedure:   Preoperative Diagnosis:       Procedure:     Gastroenterologist:  Anesthesia:  11/21/2014    GI BLEED, VOMITING                            Procedure(s):  EGD, COLONOSCOPY  COLONOSCOPY                                 Levonne Spiller, MD                         MAC            History and procedure indication:  Ricky Kelley is a 49 y.o. African American male who presents with: Gastrointestinal Bleeding          Rectal bleeding  Abdominal pain, epigastric  Gastrointestinal hemorrhage    Past Medical History   Diagnosis Date   ??? Chronic kidney disease      dialysis MWF   ??? Diabetes (HCC)    ??? HTN (hypertension)       Prior to Admission medications    Medication Sig Start Date End Date Taking? Authorizing Provider   OTHER by Other route every thirty (30) days. Indications: with dialysis   Yes Historical Provider   amLODIPine (NORVASC) 10 mg tablet  05/31/13   Phys Other, MD   [facility] ULTRA TEST strip  07/25/13   Phys Other, MD   carvedilol (COREG) 25 mg tablet  05/31/13   Phys Other, MD   citalopram (CELEXA) 10 mg tablet  06/21/13   Phys Other, MD   gabapentin (NEURONTIN) 300 mg capsule  05/31/13   Phys Other, MD   hydrALAZINE (APRESOLINE) 100 mg tablet  05/31/13   Phys Other, MD   LANTUS SOLOSTAR 100 unit/mL (3 mL) pen  06/08/13   Phys Other, MD   pravastatin (PRAVACHOL) 10 mg tablet  05/31/13   Phys Other, MD   RENVELA 800 mg tab tab  06/15/13   Phys Other, MD   RENAGEL 800 mg tablet  05/04/13   Phys Other, MD     No Known Allergies    History reviewed. No pertinent past surgical history.  Family History   Problem Relation Age of Onset   ??? Heart Attack Other    ??? Diabetes Other    ??? Hypertension Mother       History    Substance Use Topics   ??? Smoking status: Not on file   ??? Smokeless tobacco: Not on file   ??? Alcohol Use: Not on file  PHYSICAL EXAM   There were no vitals taken for this visit.    General appearance:  alert, well appearing, and in no distress  Mental status:  normal mood, behavior, speech, dress, motor activity and thought processes  Nose:      normal and patent, no erythema, discharge or polyps  Mouth:- mucous membranes moist, pharynx normal without lesions                    No Loose teeth          Loose teeth  Finger opening:  1     1.5     2      2.5      3       3.5      4   Mallampati:          Class 1      Class 2     Class 3       Class 4      Neck - supple,       Full ROM  Decreased ROM   Short Neck no significant adenopathy    Chest - clear to auscultation, no wheezes, rales or rhonchi, symmetric air entry  Heart: normal rate, regular rhythm, normal S1, S2, no murmurs, rubs, clicks or gallops  Abdomen: abdomen soft, bowel sounds   normal   increased   hypoactive                        no tenderness   epigastric tenderness   LLQ tenderness    RLQ tenderness                      No masses, organomegaly or guarding.  Rectal exam: negative without mass, lesions or tenderness  Extremities: peripheral pulses normal, no pedal edema, no clubbing or cyanosis  Neurologic: Alert and oriented to person, place, and time; normal strength and tone.                         Normal symmetric reflexes  Normal gait:                                      Assessement:                                 Pre op dx:  GI BLEED, VOMITING   Additional medical problems list below   Patient Active Problem List   Diagnosis Code   ??? ESRD (end stage renal disease) (HCC) N18.6   ??? Colitis K52.9   ??? HTN (hypertension) I10   ??? Cocaine abuse F14.10                                                                                          This note documentation was performed prior to this planned procedure       after a  history and physical was performed in the office. Date: 7 13 16                                      Pre Procedure Evaluation (per anesthesia)          Moderate Sedation/Conscious Sedation Assessment:                                                                                               Mallampati Classification                           Class 1                    Class 2                     Class 3                   Class 4                                              ASA classfication              Class I: Normally healthy              Class II: Patient with mild systemic disease (e.g. hypertension)              Class III: Patient with severe systemic disease (e.g. CHF), non-decompensated              Class IV: Patient with severe systemic disease, decompensated              Class V: Moribund patient, survival unlikely                     Plan:    Egd                                Colonoscopy                                with Moderate Sedation /Conscious Sedation                                  MAC          Patient stable for planned procedure. See orders.     Levonne Spiller, MD

## 2014-11-20 NOTE — Progress Notes (Signed)
[facility]  Endoscopy Preprocedure Instructions      1. On the day of your surgery, please report to the Main Hospital Entrance. If arriving prior to 7 AM please enter through the Emergency Room Entrance. yes    2. You must have a responsible adult to drive you to the hospital, stay at the hospital during your procedure and drive you home. If they leave your procedure will not be started (no exceptions). yes    3. Do not have anything to eat or drink (including water, gum, mints, coffee, and juice) after midnight. This does not apply to the medications you were instructed to take by your physician.yesIf you are currently taking Plavix, Coumadin, Aspirin, or other blood-thinning agents, contact your physician for special instructions. yes,    4. If you are having a procedure that requires bowel prep: We recommend that you have only clear liquids the day before your procedure and begin your bowel prep by 5:00 pm.  You may continue to drink clear liquids until midnight.  If for any reason you are not able to complete your prep please notify your physician immediately. yes    5. Have a list of all current medications, including vitamins, herbal supplements and any other over the counter medications. yes    6. If you wear glasses, contacts, dentures and/or hearing aids, they may be removed prior to procedure, please bring a case to store them in. yes    7. You should understand that if you do not follow these instructions your procedure may be cancelled.  If your physical condition changes (I.e. fever, cold or flu) please contact your doctor as soon as possible.    8. It is important that you be on time.  If for any reason you are unable to keep your appointment please call [phone] the day of or your physician???s office prior to your scheduled procedure

## 2014-11-21 ENCOUNTER — Inpatient Hospital Stay: Payer: MEDICARE

## 2014-11-21 MED ORDER — SODIUM CHLORIDE 0.9 % IJ SYRG
INTRAMUSCULAR | Status: DC | PRN
Start: 2014-11-21 — End: 2014-11-21

## 2014-11-21 MED ORDER — SIMETHICONE 40 MG/0.6 ML ORAL DROPS, SUSP
40 mg/0.6 mL | Freq: Four times a day (QID) | ORAL | Status: DC | PRN
Start: 2014-11-21 — End: 2014-11-21

## 2014-11-21 MED ORDER — ATROPINE 0.1 MG/ML SYRINGE
0.1 mg/mL | Freq: Once | INTRAMUSCULAR | Status: DC | PRN
Start: 2014-11-21 — End: 2014-11-21

## 2014-11-21 MED ORDER — EPINEPHRINE 0.1 MG/ML SYRINGE
0.1 mg/mL | Freq: Once | INTRAMUSCULAR | Status: DC | PRN
Start: 2014-11-21 — End: 2014-11-21

## 2014-11-21 MED ORDER — FLUMAZENIL 0.1 MG/ML IV SOLN
0.1 mg/mL | INTRAVENOUS | Status: DC | PRN
Start: 2014-11-21 — End: 2014-11-21

## 2014-11-21 MED ORDER — DIPHENHYDRAMINE HCL 50 MG/ML IJ SOLN
50 mg/mL | Freq: Once | INTRAMUSCULAR | Status: DC
Start: 2014-11-21 — End: 2014-11-21

## 2014-11-21 MED ORDER — SODIUM CHLORIDE 0.9 % IV
INTRAVENOUS | Status: DC
Start: 2014-11-21 — End: 2014-11-21

## 2014-11-21 MED ORDER — MIDAZOLAM 1 MG/ML IJ SOLN
1 mg/mL | INTRAMUSCULAR | Status: DC | PRN
Start: 2014-11-21 — End: 2014-11-21
  Administered 2014-11-21 (×5): via INTRAVENOUS

## 2014-11-21 MED ORDER — DEXTROSE 5% IN NORMAL SALINE IV
INTRAVENOUS | Status: DC
Start: 2014-11-21 — End: 2014-11-21

## 2014-11-21 MED ORDER — BENZOCAINE 20 % MUCOSAL AEROSOL SPRAY
20 % | Status: DC | PRN
Start: 2014-11-21 — End: 2014-11-21
  Administered 2014-11-21: 14:00:00

## 2014-11-21 MED ORDER — LORAZEPAM 2 MG/ML IJ SOLN
2 mg/mL | INTRAMUSCULAR | Status: DC | PRN
Start: 2014-11-21 — End: 2014-11-21

## 2014-11-21 MED ORDER — FENTANYL CITRATE (PF) 50 MCG/ML IJ SOLN
50 mcg/mL | Freq: Once | INTRAMUSCULAR | Status: AC
Start: 2014-11-21 — End: 2014-11-21
  Administered 2014-11-21: 14:00:00 via INTRAVENOUS

## 2014-11-21 MED ORDER — SUCRALFATE 1 GRAM TAB
1 gram | ORAL_TABLET | Freq: Four times a day (QID) | ORAL | Status: DC
Start: 2014-11-21 — End: 2020-03-05

## 2014-11-21 MED ORDER — LIDOCAINE 2 % MUCOSAL SOLN
2 % | Status: DC | PRN
Start: 2014-11-21 — End: 2014-11-21

## 2014-11-21 MED ORDER — SODIUM CHLORIDE 0.9 % IJ SYRG
Freq: Three times a day (TID) | INTRAMUSCULAR | Status: DC
Start: 2014-11-21 — End: 2014-11-21

## 2014-11-21 MED ORDER — OMEPRAZOLE 40 MG CAP, DELAYED RELEASE
40 mg | ORAL_CAPSULE | Freq: Every day | ORAL | Status: DC
Start: 2014-11-21 — End: 2015-01-10

## 2014-11-21 MED ORDER — NALOXONE 0.4 MG/ML INJECTION
0.4 mg/mL | INTRAMUSCULAR | Status: DC | PRN
Start: 2014-11-21 — End: 2014-11-21

## 2014-11-21 MED FILL — ATROPINE 0.1 MG/ML SYRINGE: 0.1 mg/mL | INTRAMUSCULAR | Qty: 10

## 2014-11-21 MED FILL — DIPHENHYDRAMINE HCL 50 MG/ML IJ SOLN: 50 mg/mL | INTRAMUSCULAR | Qty: 1

## 2014-11-21 MED FILL — FENTANYL CITRATE (PF) 50 MCG/ML IJ SOLN: 50 mcg/mL | INTRAMUSCULAR | Qty: 2

## 2014-11-21 MED FILL — BD POSIFLUSH NORMAL SALINE 0.9 % INJECTION SYRINGE: INTRAMUSCULAR | Qty: 10

## 2014-11-21 MED FILL — SODIUM CHLORIDE 0.9 % IV: INTRAVENOUS | Qty: 1000

## 2014-11-21 MED FILL — DEXTROSE 5% IN NORMAL SALINE IV: INTRAVENOUS | Qty: 1000

## 2014-11-21 MED FILL — MIDAZOLAM 1 MG/ML IJ SOLN: 1 mg/mL | INTRAMUSCULAR | Qty: 5

## 2014-11-21 MED FILL — FLUMAZENIL 0.1 MG/ML IV SOLN: 0.1 mg/mL | INTRAVENOUS | Qty: 5

## 2014-11-21 MED FILL — SIMETHICONE 40 MG/0.6 ML ORAL DROPS, SUSP: 40 mg/0.6 mL | ORAL | Qty: 30

## 2014-11-21 MED FILL — HURRICAINE 20 % MUCOSAL SPRAY: 20 % | Qty: 57

## 2014-11-21 MED FILL — NALOXONE 0.4 MG/ML INJECTION: 0.4 mg/mL | INTRAMUSCULAR | Qty: 1

## 2014-11-21 MED FILL — EPINEPHRINE 0.1 MG/ML SYRINGE: 0.1 mg/mL | INTRAMUSCULAR | Qty: 10

## 2014-11-21 NOTE — Procedures (Signed)
G I Procedure Note                         EGD    Dr. Despina Arias office [phone]  [facility]    [phone]          Ricky Kelley                              [id number]                                 [id number]   1965-10-19                       49 y.o.                male        Procedure Date: 11/21/2014   Procedure  EGD  with biopsy.           Pre Op Diagnosis:                     1. GI BLEED, VOMITING                                                                                                                                                                          Post Op Diagnosis:                   1.   Gastric erosions, Duodenal ulcer, Healing Mallory Weiss Tear, AVM of Fundus                                                                2.    3.             H&p completed  Yes    Anesthesia Assessment Performed prior to procedure:No change   Medications Medication Record            Description of Procedure:  Ricky Kelley  was seen in the endoscopy suite and the pre procedure evaluation was completed.  The patient was identified as Ricky Kelley  and the procedure verified as EGD with biopsy.  A Time Out was held and the above information confirmed.  The risks, benefits, complications, treatment options and expected outcomes were  discussed with the patient.  The possibilities of reaction to medication, pulmonary aspiration, perforation of a viscus, bleeding, failure to diagnose a condition and creating a complication requiring transfusion or operation were discussed with the patient who  Permit obtained and risks explained ( INCLUDING PERFORATION AND NEED FOR EMERGENCY SURGERY)     Procedure Note:  With the patient in the left lateral position and after appropriate conscious anesthesia the Olympus Video endoscope was passed under direct vision into the oropharynx.  The oropharynx appeared Normal   The  instrument was advanced into the esophagus and the findings were a normal appearing esophageal mucosa without other anatomic abnormalities.  The instrument was advanced into the stomach through the esophagogastric junction.  The gastroscope was advanced progressively through the stomach visualizing the body and antral areas.  The findings were a moderate gastritis with erosions.  A biopsy was obtained.        The instrument was further advanced through the pylorus into the duodenum.  The bulb of the duodenum and the second portion of the duodenum were examined and the findings were a smooth appearing duodenal mucosa with mild erythema.     The endoscope was withdrawn back into the stomach and retroflexed with examination of the fundus and cardia and the findings were no additional abnormalities .  The instrument was withdrawn back into the esophagus and the the finding were avm in the fundus no bleeding.   Biopsies were obtained for helicobacter testing and pathologic analysis from the representative areas.      The endoscope was completely withdrawn and the patient tolerated the procedure well.    1.  Blood loss was nominal.  2.  For biopsy  Specimen verification by physician and nurse two sources name,           social security numbers     Suggestions  Plan 1. - Acid suppression with a proton pump inhibitor.  - Await pathology.  - Await CLO test result and treat for Helicobacter pylori if positive.  - carafate 1 tablet four times a day for 2 months       Ricky Kelley M.D.     Fay Records, MD                                                                                                                      G I Procedure Note            COLONOSCOPY   Dr. Despina Arias office [phone]  [facility]    [phone]        Ricky Kelley                                   [id number]  [id number]    [birth year]                                      49 y.o.                                    male      Procedure Date: 11/21/2014                                                                                                              Pre Op Diagnosis:                     1. GI BLEED, VOMITING                                                                                                                                                                          Post Op Diagnosis:                   1.  Hemorrhoids                                                                    H&p completed: Yes            Anesthesia Assessment: Performed prior to procedure:      No change  Anesthesia Plan: Performed prior to procedure:                   No change       Medications: See Reviewed List and Reconcilation           Informed consent was obtained     Risk Statement:  Prior to the procedure the risks were explained to the patient and/or to the family including but not limited to perforation, bleeding, adverse drug reaction, aspiration, and even the  need for possible surgery.  A colonoscopy exam is not 100% accurate which may be related to preparation or blind spots during the exam.The possibility that an abnormality and /or cancer could be missed was also discussed as well as alternative x-ray options.         Instrument:    Olympus adult Videocolonoscope                                 Immediate Procedure Reassessment Completed     With the patient in the left lateral position, a rectal examination was performed and the findings were: negative without mass, lesions or tenderness   The Olympus Video colonoscope was inserted under direct vision into the rectum. The colonoscope was passed from the rectum to the cecum, which was identified by the ileocecal valve. The colon findings demonstrated:  ANUS: Anal exam reveals no masses or external hemorrhoids, sphincter tone is normal.   RECTUM: Rectal exam reveals no masses  .    SIGMOID COLON: The mucosa is normal with good vascular pattern and without ulcers, diverticula, and polyps.   DESCENDING COLON: The mucosa is normal with good vascular pattern and without ulcers, diverticula, and polyps.   SPLENIC FLEXURE: The splenic flexure is normal.   TRANSVERSE COLON: The mucosa is normal with good vascular pattern and without ulcers, diverticula, and polyps.   HEPATIC FLEXURE: The hepatic flexure is normal.   ASCENDING COLON: The mucosa is normal with good vascular pattern and without ulcers, diverticula, and polyps.   CECUM:  The ileocecal valve appears normal.   TERMINAL ILEUM: The terminal ileum was not entered.              .     The colonoscope was slowly withdrawn >6 minute period and the instrument was retroflexed in the rectum. The rectal findings were:Protruding lesions:     -Internal Hemorrhoids  The patient tolerated the entire procedure well.      Blood Loss minimal nominal    For biopsy  Specimen verification by physician and nurse two sources, name,           social security numbers     Colon preparation was good    Recommendations:     - Repeat colonoscopy in 5 years.      Copies sent to   [facility], MD  CC:  Fay Records, MD

## 2014-11-21 NOTE — H&P (Signed)
Immediate  Pre-Procedure Assessment:         The history and physical exam has been reviewed.  There has been no clinical changes since the completion of the most recent history and physical exam or progress note.    Note this was performed in the holding area prior to the patient being moved into the room.        The patient is identified by the physician and nurse;  the procedure was confirmed  by the patient, the nurse and the physician.  If the patient is unable to confirm the  record reflects the appropriate confirmation by the patient's durable POA.       Date of Procedure:  11/21/2014     Preoperative Diagnosis:  GI BLEED, VOMITING        Procedure:  Procedure(s):  EGD, COLONOSCOPY  COLONOSCOPY        Gastroenterologist: Levonne SpillerHORACE J Twilla Khouri, MD     Anesthesia:  Con-Sed       with Moderate Sedation /Conscious Sedation    with MAC     Signed by: Levonne SpillerHORACE J Jacub Waiters, MD                      November 21, 2014   9:19 AM

## 2014-11-21 NOTE — Procedures (Signed)
Procedures  by Levonne Spiller, MD at 11/21/14 1044                Author: Levonne Spiller, MD  Service: Internal Medicine  Author Type: Physician       Filed: 11/21/14 1048  Date of Service: 11/21/14 1044  Status: Signed          Editor: Levonne Spiller, MD (Physician)            Pre-procedure Diagnoses        1. Iron deficiency anemia due to chronic blood loss [D50.0]        2. Gastrointestinal hemorrhage, unspecified gastrointestinal hemorrhage type [K92.2]                           Post-procedure Diagnoses        1. Duodenal ulcer [K26.9]        2. 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 Gastritis [K29.70]        4. Internal hemorrhoids [K64.8]                           Procedures        1. EGD [[id number] (Custom)]        2. COLONOSCOPY [[id number] (Custom)]                                                                                                            G I Procedure Note                          EGD         Dr. Despina Arias office [phone]   [facility]    [phone]           JULE SCHLABACH                              [id number]                                 [id number]    [birth year]                       49 y.o.                 male               Procedure Date: 11/21/2014     Procedure  EGD  with biopsy.                      Pre Op Diagnosis:  1. GI BLEED, VOMITING                                                                                                                                                                                      Post Op Diagnosis:                    1.   Gastric erosions, Duodenal ulcer, Healing Mallory Weiss Tear, AVM of Fundus                                                                       2.        3.                           H&p completed   Yes       Anesthesia Assessment  Performed prior to procedure:No change      Medications  Medication Record                        Description of Procedure:   KAHLEN Kelley  was seen in the endoscopy suite and the pre procedure evaluation was completed.  The patient was identified as Ricky Kelley  and the procedure verified as EGD with biopsy.  A Time  Out was held and the above information confirmed.       The risks, benefits, complications, treatment options and expected outcomes were discussed with the patient.  The possibilities of reaction to medication,  pulmonary aspiration, perforation of a viscus, bleeding, failure to diagnose a condition and creating a complication requiring transfusion or operation were discussed with the patient who    Permit obtained and risks explained ( INCLUDING PERFORATION AND NEED FOR EMERGENCY SURGERY)          Procedure Note:   With the patient in the left lateral position and after appropriate conscious anesthesia the  Olympus Video endoscope was passed under direct vision into the oropharynx.   The oropharynx appeared Normal    The instrument was advanced into the esophagus and the findings were  a normal appearing esophageal mucosa without other anatomic abnormalities.  The instrument was advanced into the stomach through the esophagogastric junction.  The gastroscope was advanced  progressively through the stomach visualizing the body and antral areas.  The findings were  a moderate gastritis with erosions.  A  biopsy was obtained.          The instrument was further advanced through the pylorus into the duodenum.  The bulb of the duodenum and the second portion  of the duodenum were examined and the findings were a smooth appearing duodenal mucosa with mild erythema.      The  endoscope was withdrawn back into the stomach and retroflexed with examination of the fundus and cardia and the findings were no additional abnormalities  .  The instrument was withdrawn back into the esophagus and the the finding were avm in the fundus no bleeding.    Biopsies were obtained for helicobacter testing and  pathologic analysis  from the representative areas.         The endoscope was completely withdrawn and the patient tolerated the procedure well.      1.  Blood loss was nominal.   2.  For biopsy  Specimen verification by physician and nurse two sources name,           social security numbers       Suggestions   Plan 1. - Acid suppression with a proton pump inhibitor.   - Await pathology.   - Await CLO test result and treat for Helicobacter pylori if positive.   - carafate 1 tablet four times a day for 2 months           Ricky Kelley Below M.D.       Ricky Recordsamian L Covington, MD                                                                                                                                                               G I Procedure Note             COLONOSCOPY        Dr. Despina AriasHorace J. Briannie Gutierrez    [place] office [phone]   [facility]    [phone]         Ricky Kelley                                   [id number]                                  [id number]    1965/07/29                                      49 y.o.  male            Procedure Date: 11/21/2014                                                                                                                  Pre Op Diagnosis:                      1. GI BLEED, VOMITING                                                                                                                                                                                      Post Op Diagnosis:                    1.  Hemorrhoids                                                                                H&p completed:  Yes                        Anesthesia Assessment: Performed prior to procedure:      No change   Anesthesia Plan: Performed prior to procedure:                   No change             Medications: See Reviewed List and Reconcilation                    Informed consent was  obtained         Risk Statement:  Prior to the  procedure the risks were explained to the patient and/or  to the family including but not limited to perforation, bleeding, adverse drug reaction, aspiration, and even the need for possible surgery.  A colonoscopy  exam is not 100% accurate which may be related to preparation or blind spots during the exam.The  possibility that an abnormality and /or cancer could be missed was also discussed as well as alternative x-ray options.             Instrument:    Olympus adult Videocolonoscope                                          Immediate Procedure Reassessment Completed           With the patient in the left lateral position, a rectal examination was performed and  the findings were:  negative without mass, lesions or tenderness   The Olympus Video colonoscope  was inserted under direct vision into the rectum. The colonoscope was passed from the rectum to the  cecum, which was identified by the ileocecal valve. The colon findings demonstrated:   ANUS: Anal exam reveals no masses or external hemorrhoids, sphincter tone is normal.    RECTUM: Rectal exam reveals no masses  .    SIGMOID COLON: The mucosa is normal with good vascular pattern and without ulcers, diverticula, and polyps.    DESCENDING COLON: The mucosa is normal with good vascular pattern and without ulcers, diverticula, and polyps.    SPLENIC FLEXURE: The splenic flexure is normal.    TRANSVERSE COLON: The mucosa is normal with good vascular pattern and without ulcers, diverticula, and polyps.    HEPATIC FLEXURE: The hepatic flexure is normal.    ASCENDING COLON: The mucosa is normal with good vascular pattern and without ulcers, diverticula, and polyps.    CECUM:  The ileocecal valve appears normal.     TERMINAL ILEUM: The terminal ileum was not entered.                   .       The colonoscope was slowly withdrawn >6 minute period and the instrument  was retroflexed in the rectum. The rectal findings were:Protruding lesions:     -Internal Hemorrhoids    The patient tolerated the entire procedure well.        Blood Loss minimal nominal      For biopsy  Specimen verification by physician and nurse two sources, name,           social security numbers       Colon preparation was good      Recommendations:      - Repeat colonoscopy in 5 years.          Copies sent to    [facility], MD   CC:  Ricky Records, MD

## 2015-01-10 ENCOUNTER — Inpatient Hospital Stay: Admit: 2015-01-10 | Discharge: 2015-01-11 | Attending: Emergency Medicine

## 2015-01-10 DIAGNOSIS — E1122 Type 2 diabetes mellitus with diabetic chronic kidney disease: Secondary | ICD-10-CM

## 2015-01-10 LAB — GLUCOSE, POC: Glucose (POC): 80 mg/dL (ref 65–100)

## 2015-01-10 NOTE — Progress Notes (Signed)
Problem: Chronic Renal Failure  Goal: *Fluid and electrolytes stabilized  Outcome: Progressing Towards Goal  Pt came to ER for HD. Current legal situation prevented him from going to his MWF 1st tx today. Pt tolerating tx well so far.

## 2015-01-10 NOTE — ED Notes (Signed)
Dialysis nurse monitoring pt.

## 2015-01-10 NOTE — Consults (Signed)
Renal -    ESRD on MWF HD (Dr. Baldo Ash)  Apparently arrested yesterday and the jail brought him to the ER for HD? He had HD on Wednesday. VSS.    Plan - 3.5 hours of HD on 3 k bath with a couple of kg's off as tolerated. Stable for discharge after from my standpoint.    Ricky Kelley

## 2015-01-10 NOTE — Procedures (Signed)
[facility] Team   Vitals   Pre   Post   Assessment   Pre   Post     Temp  Temp: 98.1 ??F (36.7 ??C) (01/10/15 1609)   LOC  A&Ox4 Care turned over to [facility]   HR   Pulse (Heart Rate): 62 (01/10/15 1609)  Lungs   Diminished bases    B/P   BP: 175/83 (01/10/15 1609)  Cardiac   S1 S2    Resp   Resp Rate: 16 (01/10/15 1609)  Skin   CDI    Pain level  Pain Intensity 1: 0 (01/10/15 1418)  Edema  Generalized trace      Orders:    Duration:   Start:    1607 End:     Total:      Dialyzer:   Dialyzer/Set Up Inspection: F-180 (01/10/15 1609)   K Bath:   Dialysate K (mEq/L): 3 (01/10/15 1609)   Ca Bath:   Dialysate CA (mEq/L): 2.5 (01/10/15 1609)   Na/Bicarb:   Dialysate NA (mEq/L): 140 (01/10/15 1609)   Target Fluid Removal:   Goal/Amount of Fluid to Remove (mL): 3000 mL (01/10/15 1609)   Access     Type & Location:   LUA AVF   Labs     Obtained/Reviewed   Critical Results Called   Date when labs were drawn-  Hgb-    HGB   Date Value Ref Range Status   08/04/2013 13.6 12.1 - 17.0 g/dL Final     K-    POTASSIUM   Date Value Ref Range Status   08/04/2013 3.8 3.5 - 5.1 mmol/L Final     Ca-   CALCIUM   Date Value Ref Range Status   08/04/2013 7.7 (L) 8.5 - 10.1 MG/DL Final     Bun-   BUN   Date Value Ref Range Status   08/04/2013 23 (H) 6 - 20 MG/DL Final     Creat-   CREATININE   Date Value Ref Range Status   08/04/2013 7.15 (H) 0.45 - 1.15 MG/DL Final     Comment:     INVESTIGATED PER DELTA CHECK PROTOCOL        Medications/ Blood Products Given     Name   Dose   Route and Time     none                Blood Volume Processed (BVP):     Net Fluid   Removed:     Comments   1550: Arrived at Pt room (ED 39). SBAR from Sedalia Muta, Charity fundraiser. Pt signed consent form. Machine set up & room rearranged. Pt A&Ox4. Shackles removed for procedure. 2 officers present.  1607: Pt cannulated with 15G needles without issue. BG /dL. Tx initiated. Diane, RN notified of BG & gave Pt apple juice & graham  crackers. Pt is hypertensive. Other VSS.   1700: Pt still hypertensive. Other VSS. Pt eating more graham crackers to help BG stability. SBAR to Jaci Standard, RN  (call RN).     Admiting Diagnosis: New Inmate Needs HD  Pt's previous clinic- [facility] [place]  Consent signed- Informed Consent Verified: Yes (01/10/15 1609)  Hepatitis Status- 01/06/15 Neg HbsAg; <10  Machine #- Machine Number: 3 (01/10/15 1609)  Telemetry status- none  Pre-dialysis wt.-

## 2015-01-10 NOTE — ED Provider Notes (Signed)
HPI Comments: Ricky Kelley is a 49 y.o. male with a PMHx of HTN, DM, Chronic kidney disease who presents ambulatory in handcuffs with police to the ED for dialysis treatment. Pt reports he is being followed by Dr. Baldo Ash at [facility] and gets dialyzed every Monday, Wednesday, and Friday. Pt notes baseline decreased urinary output but does not state any other health issues. Pt admits to tobacco use. Pt specifically denies nausea, vomiting, diarrhea, fever, chills, SOB, or CP.    PCP: Fay Records, MD    There are no other complaints, changes or physical findings at this time.      The history is provided by the patient.        Past Medical History:   Diagnosis Date   ??? Chronic kidney disease      dialysis MWF   ??? Diabetes (HCC)    ??? HTN (hypertension)        Past Surgical History:   Procedure Laterality Date   ??? Vascular surgery procedure unlist       dialysis shunt l arm 2012   ??? Colonoscopy N/A 11/21/2014     COLONOSCOPY performed by Levonne Spiller, MD at [facility] ENDOSCOPY         Family History:   Problem Relation Age of Onset   ??? Hypertension Mother    ??? Hypertension Sister    ??? Heart Attack Other    ??? Diabetes Other        Social History     Social History   ??? Marital status: SINGLE     Spouse name: N/A   ??? Number of children: N/A   ??? Years of education: N/A     Occupational History   ??? Not on file.     Social History Main Topics   ??? Smoking status: Current Every Day Smoker     Packs/day: 0.50   ??? Smokeless tobacco: Not on file   ??? Alcohol use No   ??? Drug use: 1.00 per week     Special: Cocaine   ??? Sexual activity: Not on file     Other Topics Concern   ??? Not on file     Social History Narrative    Smoke cigs for 10 ys , half pack    No alcohol, denies drugs         ALLERGIES: Review of patient's allergies indicates no known allergies.    Review of Systems   Constitutional: Negative for appetite change, chills and fever.   HENT: Negative for congestion.    Eyes: Negative for visual disturbance.    Respiratory: Negative for cough, shortness of breath and wheezing.    Cardiovascular: Negative for chest pain, palpitations and leg swelling.   Gastrointestinal: Negative for abdominal pain, diarrhea, nausea and vomiting.   Genitourinary: Negative for dysuria, frequency and urgency.   Musculoskeletal: Negative for back pain, joint swelling, myalgias and neck stiffness.   Skin: Negative for rash.   Neurological: Negative for dizziness, syncope, weakness and headaches.   Hematological: Negative for adenopathy.   Psychiatric/Behavioral: Negative for behavioral problems and dysphoric mood.   All other systems reviewed and are negative.      Patient Vitals for the past 12 hrs:   Temp Pulse Resp BP SpO2   01/10/15 1938 97.5 ??F (36.4 ??C) 71 20 178/78 99 %   01/10/15 1930 - 68 20 184/77 98 %   01/10/15 1900 - 71 21 182/85 98 %   01/10/15  1830 - 74 20 173/83 96 %   01/10/15 1800 - 60 17 167/78 97 %   01/10/15 1730 - 68 21 163/73 98 %   01/10/15 1700 - 62 16 173/80 100 %   01/10/15 1630 - 63 16 177/78 100 %   01/10/15 1609 98.1 ??F (36.7 ??C) 62 16 175/83 100 %   01/10/15 1509 - 72 16 120/73 100 %   01/10/15 1418 98.1 ??F (36.7 ??C) 63 16 (!) 182/103 96 %       Physical Exam   Constitutional: He is oriented to person, place, and time. He appears well-developed and well-nourished. No distress.   HENT:   Head: Normocephalic and atraumatic.   Eyes: Conjunctivae are normal. Pupils are equal, round, and reactive to light.   Neck: Normal range of motion. Neck supple. No JVD present.   Cardiovascular: Normal rate, regular rhythm, normal heart sounds and intact distal pulses.  Exam reveals no gallop and no friction rub.    No murmur heard.  Pulmonary/Chest: Effort normal and breath sounds normal. No stridor. No respiratory distress. He has no wheezes. He has no rales. He exhibits no tenderness.   Abdominal: Soft. Bowel sounds are normal. He exhibits no distension and no mass. There is no tenderness. There is no rebound and no guarding.    Musculoskeletal: Normal range of motion. He exhibits no edema or tenderness.   Neurological: He is alert and oriented to person, place, and time. He displays normal reflexes. No cranial nerve deficit. He exhibits normal muscle tone. Coordination normal.   Skin: Skin is warm and dry. No rash noted. He is not diaphoretic. No erythema. No pallor.   Nursing note and vitals reviewed.       MDM  Number of Diagnoses or Management Options  Encounter for dialysis [facility]):   ESRD (end stage renal disease) [facility]):   Diagnosis management comments: Diff dx-ESRD,pulmonary edema    Imp/plan-presents from jail for routine dialysis, no complaints, no SOB. Dialyzed in ER and discharged back to jail       Amount and/or Complexity of Data Reviewed  Obtain history from someone other than the patient: yes  Discuss the patient with other providers: yes (Nephrology)    Risk of Complications, Morbidity, and/or Mortality  Presenting problems: moderate  Diagnostic procedures: low  Management options: low    Patient Progress  Patient progress: stable    ED Course       Procedures    CONSULT NOTE:   3:25 PM  Retta Diones, MD spoke with Dr. Deeann Saint,  Specialty: Nephrology  Discussed pt's hx, disposition, and available diagnostic and imaging results.  Reviewed care plans.  Consultant agrees with plans as outlined. Dr. Vaughan Sine will assist with dialysis treatment.  Written by Lance Bosch, ED Scribe, as dictated by Jule Ser, MD.    PROGRESS NOTE:  3:46 PM  Dialysis nurse at bedside.  Written by Lance Bosch, ED Scribe, as dictated by Jule Ser, MD.    LABORATORY TESTS:  Recent Results (from the past 12 hour(s))   GLUCOSE, POC    Collection Time: 01/10/15  4:04 PM   Result Value Ref Range    Glucose (POC) 80 65 - 100 mg/dL    Performed by Temple Pacini         IMPRESSION:  1. ESRD (end stage renal disease) (HCC)    2. Encounter for dialysis [facility])        PLAN:  1.  Discharge Medication List as of 01/10/2015  5:38 PM      CONTINUE these medications which have NOT CHANGED    Details   cloNIDine HCl (CATAPRES) 0.2 mg tablet Take 0.2 mg by mouth two (2) times a day., Historical Med      OTHER by Other route every thirty (30) days. Indications: with dialysis, Historical Med      amLODIPine (NORVASC) 10 mg tablet Historical Med, R-5      ONETOUCH ULTRA TEST strip Historical Med, R-0      gabapentin (NEURONTIN) 300 mg capsule Historical Med, R-6      hydrALAZINE (APRESOLINE) 100 mg tablet Historical Med, R-0      LANTUS SOLOSTAR 100 unit/mL (3 mL) pen Historical Med, R-6      pravastatin (PRAVACHOL) 10 mg tablet Historical Med, R-10      RENVELA 800 mg tab tab Historical Med, R-4      RENAGEL 800 mg tablet Historical Med, R-4      sucralfate (CARAFATE) 1 gram tablet Take 1 Tab by mouth four (4) times daily., Print, Disp-120 Tab, R-2         Return to ED if worse     DISCHARGE NOTE  8:02 PM  The patient has been re-evaluated and is ready for discharge. Reviewed available results with patient. Counseled pt on diagnosis and care plan. Pt has expressed understanding, and all questions have been answered. Pt agrees with plan and agrees to F/U as recommended, or return to the ED if their sxs worsen. Discharge instructions have been provided and explained to the pt, along with reasons to return to the ED.  Written by Lance Bosch, ED Scribe, as dictated by Jule Ser, MD          This note is prepared by Lance Bosch, acting as Scribe for Jule Ser, MD.    Jule Ser, MD: The scribe's documentation has been prepared under my direction and personally reviewed by me in its entirety. I confirm that the note above accurately reflects all work, treatment, procedures, and medical decision making performed by me.

## 2015-01-10 NOTE — ED Notes (Signed)
Pt is a new inmate at [facility]  He comes in accompanied by law enforcement officers in handcuffs and ankle chains.  He is here for dialysis.  His regular facility does not accept inmates.  Arrangements are in the process of being made for continued outpatient dialysis.  His nephrologist is Dr. Baldo Ash.

## 2015-01-10 NOTE — Procedures (Signed)
[facility] Team   Vitals   Pre   Post   Assessment   Pre   Post     Temp  Temp: 98.1 ??F (36.7 ??C) (01/10/15 1609)  97.5 LOC  A & o x 4 unchanged   HR   Pulse (Heart Rate): 62 (01/10/15 1700) 71 Lungs   Dim lower lobes  clear   B/P   BP: 173/80 (01/10/15 1700) 178/78 Cardiac   s1s2 reg  unchanged   Resp   Resp Rate: 16 (01/10/15 1700) 20 Skin   Intact dry warm  unchanged   Pain level  Pain Intensity 1: 0 (01/10/15 1418) 0 Edema    Abdomen mildly distended   trace   Orders: HEMODIALYSIS INPATIENT Duration (hr): 3.5; Dialysate Bath: ??K: 3; Dialysate Bath: ??CA: 2.5; Weight Loss (kg): 2; Blood Flow Rate (mL/min): 400; Dialysate Flow Rate (mL/min): 700 ONE TIME Routine [DIA12] (Order [id number])   Duration:   Start:    1607 End:    1938 Total:   3.5hrs   Dialyzer:   Dialyzer/Set Up Inspection: F-180 (01/10/15 1609)   K Bath:   Dialysate K (mEq/L): 3 (01/10/15 1609)   Ca Bath:   Dialysate CA (mEq/L): 2.5 (01/10/15 1609)   Na/Bicarb:   Dialysate NA (mEq/L): 140 (01/10/15 1609)   Target Fluid Removal:   Goal/Amount of Fluid to Remove (mL): 3000 mL (01/10/15 1609)   Access     Type & Location:   LUE AVF   Labs     Obtained/Reviewed   Critical Results Called   Date when labs were drawn-  Hgb-    HGB   Date Value Ref Range Status   08/04/2013 13.6 12.1 - 17.0 g/dL Final     K-    POTASSIUM   Date Value Ref Range Status   08/04/2013 3.8 3.5 - 5.1 mmol/L Final     Ca-   CALCIUM   Date Value Ref Range Status   08/04/2013 7.7 (L) 8.5 - 10.1 MG/DL Final     Bun-   BUN   Date Value Ref Range Status   08/04/2013 23 (H) 6 - 20 MG/DL Final     Creat-   CREATININE   Date Value Ref Range Status   08/04/2013 7.15 (H) 0.45 - 1.15 MG/DL Final     Comment:     INVESTIGATED PER DELTA CHECK PROTOCOL        Medications/ Blood Products Given     Name   Dose   Route and Time        None given             Blood Volume Processed (BVP):    88.8 Net Fluid   Removed:    Comments    1700: SBAR done at bedside. Assumed of pt care from C. Nightengale, RN  1800: pt progressing well through tx  1830: pt eating at bedside.   1900: pt tolerating tx well.   1938: tx ended. Pt rinsed back. x2 #15g needles removed from access without any issues. Dressing and pressure applied to site until bleeding stopped. New dressing reapplied to site and secured with tape.   1950: SBAR done with Diane,Rn. Pt stable. No other concerns voiced or irregularities noted post tx. Physician aware of pt's BP post tx. Pt verbalized he will take his bp medication when he gets to his destination. Guards also made aware of pt's BP and need to take his bp medication.  Admiting Diagnosis: Inmate requiring immediate HD  Pt's previous clinic-[facility] [place]  Consent signed- Informed Consent Verified: Yes (01/10/15 1609)  Hepatitis Status- 01/06/15 Neg HbsAg <10  Machine #- Machine Number: 3 (01/10/15 1609)  Telemetry status-non tele  Pre-dialysis wt.-  81.6kg

## 2015-01-10 NOTE — Procedures (Signed)
 [facility]   Vitals   Pre   Post   Assessment   Pre   Post     Temp  Temp: 98.1 F (36.7 C) (01/10/15 1609)   LOC  A&Ox4 Care turned over to [facility]   HR   Pulse (Heart Rate): 62 (01/10/15 1609)  Lungs   Diminished bases    B/P   BP: 175/83 (01/10/15 1609)  Cardiac   S1 S2    Resp   Resp Rate: 16 (01/10/15 1609)  Skin   CDI    Pain level  Pain Intensity 1: 0 (01/10/15 1418)  Edema  Generalized trace      Orders:    Duration:   Start:    1607 End:     Total:      Dialyzer:   Dialyzer/Set Up Inspection: F-180 (01/10/15 1609)   K Bath:   Dialysate K (mEq/L): 3 (01/10/15 1609)   Ca Bath:   Dialysate CA (mEq/L): 2.5 (01/10/15 1609)   Na/Bicarb:   Dialysate NA (mEq/L): 140 (01/10/15 1609)   Target Fluid Removal:   Goal/Amount of Fluid to Remove (mL): 3000 mL (01/10/15 1609)   Access     Type & Location:   LUA AVF   Labs     Obtained/Reviewed   Critical Results Called   Date when labs were drawn-  Hgb-    HGB   Date Value Ref Range Status   08/04/2013 13.6 12.1 - 17.0 g/dL Final     K-    POTASSIUM   Date Value Ref Range Status   08/04/2013 3.8 3.5 - 5.1 mmol/L Final     Ca-   CALCIUM   Date Value Ref Range Status   08/04/2013 7.7 (L) 8.5 - 10.1 MG/DL Final     Bun-   BUN   Date Value Ref Range Status   08/04/2013 23 (H) 6 - 20 MG/DL Final     Creat-   CREATININE   Date Value Ref Range Status   08/04/2013 7.15 (H) 0.45 - 1.15 MG/DL Final     Comment:     INVESTIGATED PER DELTA CHECK PROTOCOL        Medications/ Blood Products Given     Name   Dose   Route and Time     none                Blood Volume Processed (BVP):     Net Fluid   Removed:     Comments   1550: Arrived at Pt room (ED 39). SBAR from Diane, Charity fundraiser. Pt signed consent form. Machine set up & room rearranged. Pt A&Ox4. Shackles removed for procedure. 2 officers present.  1607: Pt cannulated with 15G needles without issue. BG 80mg /dL. Tx initiated. Diane, RN notified of BG & gave Pt apple juice & graham crackers. Pt is hypertensive. Other  VSS.   1700: Pt still hypertensive. Other VSS. Pt eating more graham crackers to help BG stability. SBAR to Curtistine Lesches, RN  (call RN).     Admiting Diagnosis: New Inmate Needs HD  Pt's previous clinic- [facility] [place]  Consent signed- Informed Consent Verified: Yes (01/10/15 1609)  Hepatitis Status- 01/06/15 Neg HbsAg; <10  Machine #- Machine Number: 3 (01/10/15 1609)  Telemetry status- none  Pre-dialysis wt.-

## 2015-01-10 NOTE — Procedures (Signed)
 [facility]   Vitals   Pre   Post   Assessment   Pre   Post     Temp  Temp: 98.1 F (36.7 C) (01/10/15 1609)  97.5 LOC  A & o x 4 unchanged   HR   Pulse (Heart Rate): 62 (01/10/15 1700) 71 Lungs   Dim lower lobes  clear   B/P   BP: 173/80 (01/10/15 1700) 178/78 Cardiac   s1s2 reg  unchanged   Resp   Resp Rate: 16 (01/10/15 1700) 20 Skin   Intact dry warm  unchanged   Pain level  Pain Intensity 1: 0 (01/10/15 1418) 0 Edema    Abdomen mildly distended   trace   Orders: HEMODIALYSIS INPATIENT Duration (hr): 3.5; Dialysate Bath: K: 3; Dialysate Bath: CA: 2.5; Weight Loss (kg): 2; Blood Flow Rate (mL/min): 400; Dialysate Flow Rate (mL/min): 700 ONE TIME Routine [DIA12] (Order [id number])   Duration:   Start:    1607 End:    1938 Total:   3.5hrs   Dialyzer:   Dialyzer/Set Up Inspection: F-180 (01/10/15 1609)   K Bath:   Dialysate K (mEq/L): 3 (01/10/15 1609)   Ca Bath:   Dialysate CA (mEq/L): 2.5 (01/10/15 1609)   Na/Bicarb:   Dialysate NA (mEq/L): 140 (01/10/15 1609)   Target Fluid Removal:   Goal/Amount of Fluid to Remove (mL): 3000 mL (01/10/15 1609)   Access     Type & Location:   LUE AVF   Labs     Obtained/Reviewed   Critical Results Called   Date when labs were drawn-  Hgb-    HGB   Date Value Ref Range Status   08/04/2013 13.6 12.1 - 17.0 g/dL Final     K-    POTASSIUM   Date Value Ref Range Status   08/04/2013 3.8 3.5 - 5.1 mmol/L Final     Ca-   CALCIUM   Date Value Ref Range Status   08/04/2013 7.7 (L) 8.5 - 10.1 MG/DL Final     Bun-   BUN   Date Value Ref Range Status   08/04/2013 23 (H) 6 - 20 MG/DL Final     Creat-   CREATININE   Date Value Ref Range Status   08/04/2013 7.15 (H) 0.45 - 1.15 MG/DL Final     Comment:     INVESTIGATED PER DELTA CHECK PROTOCOL        Medications/ Blood Products Given     Name   Dose   Route and Time        None given             Blood Volume Processed (BVP):    88.8 Net Fluid   Removed:    Comments   1700: SBAR done at bedside. Assumed of pt  care from C. Nightengale, RN  1800: pt progressing well through tx  1830: pt eating at bedside.   1900: pt tolerating tx well.   1938: tx ended. Pt rinsed back. x2 #15g needles removed from access without any issues. Dressing and pressure applied to site until bleeding stopped. New dressing reapplied to site and secured with tape.   1950: SBAR done with Diane,Rn. Pt stable. No other concerns voiced or irregularities noted post tx. Physician aware of pt's BP post tx. Pt verbalized he will take his bp medication when he gets to his destination. Guards also made aware of pt's BP and need to take his bp medication.  Admiting Diagnosis: Inmate requiring immediate HD  Pt's previous clinic-[facility] [place]  Consent signed- Informed Consent Verified: Yes (01/10/15 1609)  Hepatitis Status- 01/06/15 Neg HbsAg <10  Machine #- Machine Number: 3 (01/10/15 1609)  Telemetry status-non tele  Pre-dialysis wt.-  81.6kg

## 2015-01-13 ENCOUNTER — Inpatient Hospital Stay
Admit: 2015-01-13 | Discharge: 2015-01-14 | Disposition: A | Discharge status: Home / Self Care | Payer: PRIVATE HEALTH INSURANCE | Attending: Emergency Medicine

## 2015-01-13 DIAGNOSIS — E875 Hyperkalemia: Secondary | ICD-10-CM

## 2015-01-13 LAB — CBC WITH AUTOMATED DIFF
ABS. BASOPHILS: 0 10*3/uL (ref 0.0–0.1)
ABS. EOSINOPHILS: 0.3 10*3/uL (ref 0.0–0.4)
ABS. LYMPHOCYTES: 1 10*3/uL (ref 0.8–3.5)
ABS. MONOCYTES: 0.4 10*3/uL (ref 0.0–1.0)
ABS. NEUTROPHILS: 4.7 10*3/uL (ref 1.8–8.0)
BASOPHILS: 0 % (ref 0–1)
EOSINOPHILS: 4 % (ref 0–7)
HCT: 33.2 % — ABNORMAL LOW (ref 36.6–50.3)
HGB: 10.5 g/dL — ABNORMAL LOW (ref 12.1–17.0)
LYMPHOCYTES: 16 % (ref 12–49)
MCH: 28.8 PG (ref 26.0–34.0)
MCHC: 31.6 g/dL (ref 30.0–36.5)
MCV: 91 FL (ref 80.0–99.0)
MONOCYTES: 6 % (ref 5–13)
NEUTROPHILS: 74 % (ref 32–75)
PLATELET: 223 10*3/uL (ref 150–400)
RBC: 3.65 M/uL — ABNORMAL LOW (ref 4.10–5.70)
RDW: 18.6 % — ABNORMAL HIGH (ref 11.5–14.5)
WBC: 6.4 10*3/uL (ref 4.1–11.1)

## 2015-01-13 LAB — METABOLIC PANEL, COMPREHENSIVE
A-G Ratio: 0.8 — ABNORMAL LOW (ref 1.1–2.2)
ALT (SGPT): 38 U/L (ref 12–78)
AST (SGOT): 13 U/L — ABNORMAL LOW (ref 15–37)
Albumin: 3.8 g/dL (ref 3.5–5.0)
Alk. phosphatase: 120 U/L — ABNORMAL HIGH (ref 45–117)
Anion gap: 12 mmol/L (ref 5–15)
BUN/Creatinine ratio: 7 — ABNORMAL LOW (ref 12–20)
BUN: 80 MG/DL — ABNORMAL HIGH (ref 6–20)
Bilirubin, total: 0.5 MG/DL (ref 0.2–1.0)
CO2: 24 mmol/L (ref 21–32)
Calcium: 8.7 MG/DL (ref 8.5–10.1)
Chloride: 99 mmol/L (ref 97–108)
Creatinine: 12 MG/DL — ABNORMAL HIGH (ref 0.70–1.30)
GFR est AA: 6 mL/min/{1.73_m2} — ABNORMAL LOW (ref >60)
GFR est non-AA: 5 mL/min/{1.73_m2} — ABNORMAL LOW (ref >60)
Globulin: 4.5 g/dL — ABNORMAL HIGH (ref 2.0–4.0)
Glucose: 86 mg/dL (ref 65–100)
Potassium: 7.1 mmol/L — CR (ref 3.5–5.1)
Protein, total: 8.3 g/dL — ABNORMAL HIGH (ref 6.4–8.2)
Sodium: 135 mmol/L — ABNORMAL LOW (ref 136–145)

## 2015-01-13 LAB — PHOSPHORUS: Phosphorus: 5 MG/DL — ABNORMAL HIGH (ref 2.6–4.7)

## 2015-01-13 LAB — GLUCOSE, POC
Glucose (POC): 87 mg/dL (ref 65–100)
Glucose (POC): 198 mg/dL — ABNORMAL HIGH (ref 65–100)

## 2015-01-13 LAB — MAGNESIUM: Magnesium: 2.9 mg/dL — ABNORMAL HIGH (ref 1.6–2.4)

## 2015-01-13 MED ORDER — HYDRALAZINE 50 MG TAB
50 mg | ORAL | Status: AC
Start: 2015-01-13 — End: 2015-01-13
  Administered 2015-01-13: 21:00:00 via ORAL

## 2015-01-13 MED ORDER — AMLODIPINE 5 MG TAB
5 mg | ORAL | Status: AC
Start: 2015-01-13 — End: 2015-01-13
  Administered 2015-01-13: via ORAL

## 2015-01-13 MED ORDER — HYDRALAZINE 20 MG/ML IJ SOLN
20 mg/mL | INTRAMUSCULAR | Status: AC
Start: 2015-01-13 — End: 2015-01-13
  Administered 2015-01-13: via INTRAVENOUS

## 2015-01-13 MED ORDER — CLONIDINE 0.1 MG TAB
0.1 mg | ORAL | Status: AC
Start: 2015-01-13 — End: 2015-01-13
  Administered 2015-01-13: via ORAL

## 2015-01-13 MED ORDER — CLONIDINE 0.1 MG TAB
0.1 mg | ORAL | Status: AC
Start: 2015-01-13 — End: 2015-01-13
  Administered 2015-01-13: 21:00:00 via ORAL

## 2015-01-13 MED FILL — CLONIDINE 0.1 MG TAB: 0.1 mg | ORAL | Qty: 2

## 2015-01-13 MED FILL — HYDRALAZINE 50 MG TAB: 50 mg | ORAL | Qty: 2

## 2015-01-13 MED FILL — HYDRALAZINE 20 MG/ML IJ SOLN: 20 mg/mL | INTRAMUSCULAR | Qty: 1

## 2015-01-13 MED FILL — AMLODIPINE 5 MG TAB: 5 mg | ORAL | Qty: 2

## 2015-01-13 NOTE — Procedures (Signed)
 [facility]   Vitals   Pre   Post   Assessment   Pre   Post     Temp  Temp: 97.6 F (36.4 C) (01/13/15 1435)  98.2 LOC  A,O X 4, pleasant and conversant. He is an incarcerated pt with 2 police officer on guard at the bedside at all times. No changes   HR   63 77 Lungs   CTA, on room air  no changes   B/P   186/107 199/105 Cardiac   NSR, elevated BP  increasing BP towards the end, was given clonidine and hydralazine p.o. By ED RN while HD was ongoing   Resp   18 20 Skin   No reported open areas No changes   Pain level  0/10 0/10 Edema  trace   No changes   Orders:    Duration:   Start:    1435 End:    1808 Total:   3.5   Dialyzer:   Dialyzer/Set Up Inspection: F-180 (01/13/15 1435)   K Bath:   Dialysate K (mEq/L): 2 (01/13/15 1435)   Ca Bath:   Dialysate CA (mEq/L): 2.5 (01/13/15 1435)   Na/Bicarb:   Dialysate NA (mEq/L): 140 (01/13/15 1435)   Target Fluid Removal:   Goal/Amount of Fluid to Remove (mL): 3000 mL (01/13/15 1435)   Access     Type & Location:   LUE AVF   Labs     Obtained/Reviewed   Critical Results Called   Date when labs were drawn-  Hgb-    HGB   Date Value Ref Range Status   01/13/2015 10.5 (L) 12.1 - 17.0 g/dL Final     K-    POTASSIUM   Date Value Ref Range Status   01/13/2015 7.1 (HH) 3.5 - 5.1 mmol/L Final     Comment:     RESULTS VERIFIED, PHONED TO AND READ BACK BY  LINDHOLM RN LAA 238PM ON DIALYSIS       Ca-   CALCIUM   Date Value Ref Range Status   01/13/2015 8.7 8.5 - 10.1 MG/DL Final     Bun-   BUN   Date Value Ref Range Status   01/13/2015 80 (H) 6 - 20 MG/DL Final     Creat-   CREATININE   Date Value Ref Range Status   01/13/2015 12.00 (H) 0.70 - 1.30 MG/DL Final        Medications/ Blood Products Given     Name   Dose   Route and Time                     Blood Volume Processed (BVP):    85.6 Net Fluid   Removed:  3 L   Comments   This patient was not dialyze immediately today since there was a need to send him to his clinic this morning however, there were  concerns with him being incarcerated and company policy per contacts made by Vernell Revel to the dialysis clinic. And thus, it was only this afternoon that HD unit here will have to dialyze him for today. He is a pt at [facility].   1337 order received from Dr. Murray Lineman: use clinic orders for now. Pt is a chronic HD patient of [facility] [place]. Will transcribe order  1435 Pt arrived in HD per stretcher, alert and awake,pleasant and conversant, with 2 police officers. His 4 point chain restraints were removed by the police officer for  this procedure. He signed the consent. MD signed. He reported that he was weighed downstairs with his shoe on and that he needed only 3 L for UF goal . Will adjust UF goal.  Started by Fleeta Quivers RN:  LUE AVF: site disinfected, cannulated with 2 G 15 needles for each accesses. Placement verified, bg checked 87. HD started  1534 Pt was calm and watching TV or chatting/talking with staff/police officers. He was awake during this treatment.  1639 BP was noted to be increasing during this whole treatment. Called for ED RN for medication management. They were also informed per patient, that he had missed almost all his medications since Wednesday including BP meds and that he was not assured if the jail RN was able to give him all his meds as listed.  [id number] ED RN gave pt a dose of clonidine and hydralazine,see MAR.  1723 Pt was calm with some c/o slight pain : headache. But denied the need for pain management  1808 Treatment completed. UF goal reached. Rinsed back blood with 250 mL of NS per protocol. Needles removed one at a time. Started with the venous access, dry gauze applied with pressure until bleeding stopped. Repeated this with the post arterial access. Total time spent was 10 minutes.   1820 Had to wait for security to be up here for him to accompany the pt with the 2 police officers back to the ED. Transport staff is wheeling patient on the Doctor, general practice.     Admiting  Diagnosis: hemodialysis MWF  Pt's previous clinic- [facility] [place]  Consent signed- Informed Consent Verified: Yes (01/13/15 1435)  Hepatitis Status- HBsAg Neg 0914/2016   Machine #- Machine Number: [id number] (01/13/15 1435)  Telemetry status- not on cardiac monitoring  Pre-dialysis wt.-  79.2 kg 01/13/2015 1116 AM

## 2015-01-13 NOTE — ED Notes (Signed)
Spoke with dialysis who will be coming to get the patient shortly.  Pt denies any needs at this time.  Provided with a blanket.

## 2015-01-13 NOTE — Procedures (Addendum)
[facility]   Vitals   Pre   Post   Assessment   Pre   Post     Temp  Temp: 97.6 ??F (36.4 ??C) (01/13/15 1435)  98.2 LOC  A,O X 4, pleasant and conversant. He is an incarcerated pt with 2 police officer on guard at the bedside at all times. No changes   HR   63 77 Lungs   CTA, on room air  no changes   B/P   186/107 199/105 Cardiac   NSR, elevated BP  increasing BP towards the end, was given clonidine and hydralazine p.o. By ED RN while HD was ongoing   Resp   18 20 Skin   No reported open areas No changes   Pain level  0/10 0/10 Edema  trace   No changes   Orders:    Duration:   Start:    1435 End:    1808 Total:   3.5   Dialyzer:   Dialyzer/Set Up Inspection: F-180 (01/13/15 1435)   K Bath:   Dialysate K (mEq/L): 2 (01/13/15 1435)   Ca Bath:   Dialysate CA (mEq/L): 2.5 (01/13/15 1435)   Na/Bicarb:   Dialysate NA (mEq/L): 140 (01/13/15 1435)   Target Fluid Removal:   Goal/Amount of Fluid to Remove (mL): 3000 mL (01/13/15 1435)   Access     Type & Location:   LUE AVF   Labs     Obtained/Reviewed   Critical Results Called   Date when labs were drawn-  Hgb-    HGB   Date Value Ref Range Status   01/13/2015 10.5 (L) 12.1 - 17.0 g/dL Final     K-    POTASSIUM   Date Value Ref Range Status   01/13/2015 7.1 (HH) 3.5 - 5.1 mmol/L Final     Comment:     RESULTS VERIFIED, PHONED TO AND READ BACK BY  LINDHOLM RN LAA 238PM ON DIALYSIS       Ca-   CALCIUM   Date Value Ref Range Status   01/13/2015 8.7 8.5 - 10.1 MG/DL Final     Bun-   BUN   Date Value Ref Range Status   01/13/2015 80 (H) 6 - 20 MG/DL Final     Creat-   CREATININE   Date Value Ref Range Status   01/13/2015 12.00 (H) 0.70 - 1.30 MG/DL Final        Medications/ Blood Products Given     Name   Dose   Route and Time                     Blood Volume Processed (BVP):    85.6 Net Fluid   Removed:  3 L   Comments   This patient was not dialyze immediately today since there was a need to  send him to his clinic this morning however, there were concerns with him being incarcerated and company policy per contacts made by Ned Grace to the dialysis clinic. And thus, it was only this afternoon that HD unit here will have to dialyze him for today. He is a pt at [facility].   1337 order received from Dr. Ignacia Bayley: use clinic orders for now. Pt is a chronic HD patient of [facility] [place]. Will transcribe order  1435 Pt arrived in HD per stretcher, alert and awake,pleasant and conversant, with 2 police officers. His 4 point chain restraints were removed by the police officer for  this procedure. He signed the consent. MD signed. He reported that he was weighed downstairs with his shoe on and that he needed only 3 L for UF goal . Will adjust UF goal.  Started by Anne Hahn RN:  LUE AVF: site disinfected, cannulated with 2 G 15 needles for each accesses. Placement verified, bg checked 87. HD started  1534 Pt was calm and watching TV or chatting/talking with staff/police officers. He was awake during this treatment.  1639 BP was noted to be increasing during this whole treatment. Called for ED RN for medication management. They were also informed per patient, that he had missed almost all his medications since Wednesday including BP meds and that he was not assured if the jail RN was able to give him all his meds as listed.  [id number] ED RN gave pt a dose of clonidine and hydralazine,see MAR.  1723 Pt was calm with some c/o slight pain : headache. But denied the need for pain management  1808 Treatment completed. UF goal reached. Rinsed back blood with 250 mL of NS per protocol. Needles removed one at a time. Started with the venous access, dry gauze applied with pressure until bleeding stopped. Repeated this with the post arterial access. Total time spent was 10 minutes.   1820 Had to wait for security to be up here for him to accompany the pt  with the 2 police officers back to the ED. Transport staff is wheeling patient on the Doctor, general practice.     Admiting Diagnosis: hemodialysis MWF  Pt's previous clinic- [facility] [place]  Consent signed- Informed Consent Verified: Yes (01/13/15 1435)  Hepatitis Status- HBsAg Neg 0914/2016   Machine #- Machine Number: [id number] (01/13/15 1435)  Telemetry status- not on cardiac monitoring  Pre-dialysis wt.-  79.2 kg 01/13/2015 1116 AM

## 2015-01-13 NOTE — ED Provider Notes (Signed)
HPI Comments: Ricky Kelley is a 49 y.o. male, pmhx HTN / DM / CKD, who presents ambulatory with police escort to the ED for dialysis treatment. Pt states he is normally a M/W/F dialysis candidate, but notes the facility refuses to dialyze him while he is currently incarcerated. He specifically denies any fevers, chills, nausea, vomiting, chest pain, shortness of breath, headache, rash, diarrhea, sweating or weight loss and is otherwise without complaint at this time.     PCP: Buel Ream, MD    Allergies: NKDA  PMHx: Significant for HTN, CKD, DM  PSHx: Significant for vascular access placement, colonoscopy  Social Hx: +tobacco, -EtOH , -Illicit Drugs (former cocaine)    There are no other complaints, changes, or physical findings at this time.     The history is provided by the patient.        Past Medical History:   Diagnosis Date   ??? Chronic kidney disease      dialysis MWF   ??? Diabetes ([place])    ??? HTN (hypertension)        Past Surgical History:   Procedure Laterality Date   ??? Vascular surgery procedure unlist       dialysis shunt l arm 2012   ??? Colonoscopy N/A 11/21/2014     COLONOSCOPY performed by Blima Rich, MD at [facility] ENDOSCOPY         Family History:   Problem Relation Age of Onset   ??? Hypertension Mother    ??? Hypertension Sister    ??? Heart Attack Other    ??? Diabetes Other        Social History     Social History   ??? Marital status: SINGLE     Spouse name: N/A   ??? Number of children: N/A   ??? Years of education: N/A     Occupational History   ??? Not on file.     Social History Main Topics   ??? Smoking status: Current Every Day Smoker     Packs/day: 0.50   ??? Smokeless tobacco: Not on file   ??? Alcohol use No   ??? Drug use: 1.00 per week     Special: Cocaine   ??? Sexual activity: Not on file     Other Topics Concern   ??? Not on file     Social History Narrative    Smoke cigs for 77 ys , half pack    No alcohol, denies drugs         ALLERGIES: Review of patient's allergies indicates no known allergies.     Review of Systems   Constitutional: Negative.  Negative for activity change, appetite change, chills, fatigue, fever and unexpected weight change.   HENT: Negative.  Negative for congestion, hearing loss, rhinorrhea, sneezing and voice change.    Eyes: Negative.  Negative for pain and visual disturbance.   Respiratory: Negative.  Negative for apnea, cough, choking, chest tightness and shortness of breath.    Cardiovascular: Negative.  Negative for chest pain and palpitations.   Gastrointestinal: Negative.  Negative for abdominal distention, abdominal pain, blood in stool, diarrhea, nausea and vomiting.   Genitourinary: Negative.  Negative for difficulty urinating, flank pain, frequency and urgency.        No discharge   Musculoskeletal: Negative.  Negative for arthralgias, back pain, myalgias and neck stiffness.   Skin: Negative.  Negative for color change and rash.   Neurological: Negative.  Negative for dizziness, seizures, syncope, speech  difficulty, weakness, numbness and headaches.   Hematological: Negative for adenopathy.   Psychiatric/Behavioral: Negative.  Negative for agitation, behavioral problems, dysphoric mood and suicidal ideas. The patient is not nervous/anxious.        Patient Vitals for the past 12 hrs:   Temp Pulse Resp BP SpO2   01/13/15 2019 - - - 165/82 -   01/13/15 2014 - 73 - (!) 180/100 96 %   01/13/15 1955 - - - (!) 208/110 -   01/13/15 1903 - 74 16 (!) 210/112 100 %   01/13/15 1530 - 69 18 (!) 196/104 -   01/13/15 1500 - (!) 58 18 163/90 -   01/13/15 1435 97.6 ??F (36.4 ??C) 63 18 (!) 186/107 100 %   01/13/15 1116 98.2 ??F (36.8 ??C) 62 18 (!) 180/101 100 %       Physical Exam   Constitutional: He is oriented to person, place, and time. He appears well-developed and well-nourished. No distress.   HENT:   Head: Normocephalic and atraumatic.   Mouth/Throat: Oropharynx is clear and moist. No oropharyngeal exudate.   Eyes: Conjunctivae and EOM are normal. Pupils are equal, round, and  reactive to light. Right eye exhibits no discharge. Left eye exhibits no discharge.   Neck: Normal range of motion. Neck supple.   Cardiovascular: Normal rate, regular rhythm and intact distal pulses.  Exam reveals no gallop and no friction rub.    No murmur heard.  Pulmonary/Chest: Effort normal and breath sounds normal. No respiratory distress. He has no wheezes. He has no rales. He exhibits no tenderness.   Fistula in LUE with palpable thrill   Abdominal: Soft. Bowel sounds are normal. He exhibits no distension and no mass. There is no tenderness. There is no rebound and no guarding.   Musculoskeletal: Normal range of motion. He exhibits no edema.   Lymphadenopathy:     He has no cervical adenopathy.   Neurological: He is alert and oriented to person, place, and time. No cranial nerve deficit. Coordination normal.   Skin: Skin is warm and dry. No rash noted. No erythema.   Psychiatric: He has a normal mood and affect.   Nursing note and vitals reviewed.  Written by Donny Pique, ED Scribe, as dictated by Serafina Mitchell. Zack Seal, MD    MDM  Number of Diagnoses or Management Options  Diagnosis management comments: DDx: volume overload, electrolyte abnormality, chronic renal failure       Amount and/or Complexity of Data Reviewed  Clinical lab tests: ordered and reviewed  Review and summarize past medical records: yes    Critical Care  Total time providing critical care: 30-74 minutes      Procedures    Chief Complaint   Patient presents with   ??? Chronic Kidney Disease     The patient presents to the Emergency Department for dialysis.  The patient is incarcerated and his normal dialysis center will not accept him while incarcerated.        11:20 AM  The patients presenting problems have been discussed, and they are in agreement with the care plan formulated and outlined with them.  I have encouraged them to ask questions as they arise throughout their visit.    MEDICATIONS GIVEN:  Medications    cloNIDine HCl (CATAPRES) tablet 0.2 mg (0.2 mg Oral Given 01/13/15 1657)   hydrALAZINE (APRESOLINE) tablet 100 mg (100 mg Oral Given 01/13/15 1657)   amLODIPine (NORVASC) tablet 10 mg (10 mg Oral Given 01/13/15 1957)  cloNIDine HCl (CATAPRES) tablet 0.2 mg (0.2 mg Oral Given 01/13/15 1957)   hydrALAZINE (APRESOLINE) 20 mg/mL injection 20 mg (20 mg IntraVENous Given 01/13/15 1958)       LABS REVIEWED:  Recent Results (from the past 24 hour(s))   CBC WITH AUTOMATED DIFF    Collection Time: 01/13/15  1:50 PM   Result Value Ref Range    WBC 6.4 4.1 - 11.1 K/uL    RBC 3.65 (L) 4.10 - 5.70 M/uL    HGB 10.5 (L) 12.1 - 17.0 g/dL    HCT 33.2 (L) 36.6 - 50.3 %    MCV 91.0 80.0 - 99.0 FL    MCH 28.8 26.0 - 34.0 PG    MCHC 31.6 30.0 - 36.5 g/dL    RDW 18.6 (H) 11.5 - 14.5 %    PLATELET 223 150 - 400 K/uL    NEUTROPHILS 74 32 - 75 %    LYMPHOCYTES 16 12 - 49 %    MONOCYTES 6 5 - 13 %    EOSINOPHILS 4 0 - 7 %    BASOPHILS 0 0 - 1 %    ABS. NEUTROPHILS 4.7 1.8 - 8.0 K/UL    ABS. LYMPHOCYTES 1.0 0.8 - 3.5 K/UL    ABS. MONOCYTES 0.4 0.0 - 1.0 K/UL    ABS. EOSINOPHILS 0.3 0.0 - 0.4 K/UL    ABS. BASOPHILS 0.0 0.0 - 0.1 K/UL   METABOLIC PANEL, COMPREHENSIVE    Collection Time: 01/13/15  1:50 PM   Result Value Ref Range    Sodium 135 (L) 136 - 145 mmol/L    Potassium 7.1 (HH) 3.5 - 5.1 mmol/L    Chloride 99 97 - 108 mmol/L    CO2 24 21 - 32 mmol/L    Anion gap 12 5 - 15 mmol/L    Glucose 86 65 - 100 mg/dL    BUN 80 (H) 6 - 20 MG/DL    Creatinine 12.00 (H) 0.70 - 1.30 MG/DL    BUN/Creatinine ratio 7 (L) 12 - 20      GFR est AA 6 (L) >60 ml/min/1.56m    GFR est non-AA 5 (L) >60 ml/min/1.777m   Calcium 8.7 8.5 - 10.1 MG/DL    Bilirubin, total 0.5 0.2 - 1.0 MG/DL    ALT 38 12 - 78 U/L    AST 13 (L) 15 - 37 U/L    Alk. phosphatase 120 (H) 45 - 117 U/L    Protein, total 8.3 (H) 6.4 - 8.2 g/dL    Albumin 3.8 3.5 - 5.0 g/dL    Globulin 4.5 (H) 2.0 - 4.0 g/dL    A-G Ratio 0.8 (L) 1.1 - 2.2     PHOSPHORUS    Collection Time: 01/13/15  1:50 PM    Result Value Ref Range    Phosphorus 5.0 (H) 2.6 - 4.7 MG/DL   MAGNESIUM    Collection Time: 01/13/15  1:50 PM   Result Value Ref Range    Magnesium 2.9 (H) 1.6 - 2.4 mg/dL   GLUCOSE, POC    Collection Time: 01/13/15  2:33 PM   Result Value Ref Range    Glucose (POC) 87 65 - 100 mg/dL    Performed by PaNori Riis  GLUCOSE, POC    Collection Time: 01/13/15  5:51 PM   Result Value Ref Range    Glucose (POC) 198 (H) 65 - 100 mg/dL    Performed by PaNori Riis  VITAL SIGNS:  Patient Vitals for the past 12 hrs:   Temp Pulse Resp BP SpO2   01/13/15 2019 - - - 165/82 -   01/13/15 2014 - 73 - (!) 180/100 96 %   01/13/15 1955 - - - (!) 208/110 -   01/13/15 1903 - 74 16 (!) 210/112 100 %   01/13/15 1530 - 69 18 (!) 196/104 -   01/13/15 1500 - (!) 58 18 163/90 -   01/13/15 1435 97.6 ??F (36.4 ??C) 63 18 (!) 186/107 100 %   01/13/15 1116 98.2 ??F (36.8 ??C) 62 18 (!) 180/101 100 %       PROGRESS NOTES:      3:00 PM  Patient's presentation, labs/imaging and plan of care was reviewed with Rada Hay, DO as part of sign out.  They will reassess pt following dialysis and dispo as part of the plan discussed with the patient.    Rada Hay, DO's assistance in completion of this plan is greatly appreciated but it should be noted that I will be the provider of record for this patient.  [place] Maiorino L. Zack Seal, MD      This note is prepared by Donny Pique, acting as Scribe for [employer]. Zack Seal, [place] Zack Seal, MD : The scribe's documentation has been prepared under my direction and personally reviewed by me in its entirety. I confirm that the note above accurately reflects all work, treatment, procedures, and medical decision making performed by me.    CRITICAL CARE NOTE :  2:52 PM    IMPENDING DETERIORATION -Metabolic and Renal  ASSOCIATED RISK FACTORS - Metabolic changes and hyperkalemia   MANAGEMENT- Bedside Assessment and Supervision of Care  INTERPRETATION - labs   INTERVENTIONS - Metobolic interventions and hemodialysis  CASE REVIEW - Medical Sub-Specialist and PCP  TREATMENT RESPONSE -Improved and Stable  PERFORMED BY - Self    NOTES   :  I have spent 30-74 minutes of critical care time involved in lab review, consultations with specialist, family decision- making, bedside attention and documentation. During this entire length of time I was immediately available to the patient .  Renaud Celli L. Zack Seal, MD      DIAGNOSIS:    1. Acute hyperkalemia    2. ESRD (end stage renal disease) ([place])    3. Secondary hypertension, hypertension with unspecified goal        PLAN:  Follow-up Information     Follow up With Details Comments Contact Info      continue your dialysis treatments for MWF         Current Discharge Medication List      CONTINUE these medications which have CHANGED    Details   cloNIDine HCl (CATAPRES) 0.2 mg tablet Take 1 Tab by mouth two (2) times a day.  Qty: 60 Tab, Refills: 0      amLODIPine (NORVASC) 10 mg tablet Take 1 Tab by mouth daily.  Qty: 30 Tab, Refills: 0      hydrALAZINE (APRESOLINE) 100 mg tablet Take 1 Tab by mouth two (2) times a day.  Qty: 60 Tab, Refills: 0         CONTINUE these medications which have NOT CHANGED    Details   sucralfate (CARAFATE) 1 gram tablet Take 1 Tab by mouth four (4) times daily.  Qty: 120 Tab, Refills: 2      OTHER by Other route every thirty (30) days. Indications: with dialysis  ONETOUCH ULTRA TEST strip Refills: 0      gabapentin (NEURONTIN) 300 mg capsule Refills: 6      LANTUS SOLOSTAR 100 unit/mL (3 mL) pen Refills: 6      pravastatin (PRAVACHOL) 10 mg tablet Refills: 10      RENVELA 800 mg tab tab Refills: 4      RENAGEL 800 mg tablet Refills: 4               ED COURSE: The patients hospital course has been uncomplicated.    8:28 PM  Ashby Dawes Pelphrey's  results have been reviewed with him.  He has been counseled regarding his diagnosis.  He verbally conveys understanding and  agreement of the signs, symptoms, diagnosis, treatment and prognosis and additionally agrees to follow up as recommended with Dr. Buel Ream, MD in 24 - 48 hours.  He also agrees with the care-plan and conveys that all of his questions have been answered.  I have also put together some discharge instr0uctions for him that include: 1) educational information regarding their diagnosis, 2) how to care for their diagnosis at home, as well a 3) list of reasons why they would want to return to the ED prior to their follow-up appointment, should their condition change.        This note is partially prepared by Payton Mccallum, acting as Scribe for Rada Hay, DO as a sign out by Wilber Oliphant L. Zack Seal, MD                 Rada Hay, DO  The scribe's documentation has been prepared under my direction and personally reviewed by me in its entirety. I confirm that the note above accurately reflects all work, treatment, procedures, and medical decision making performed by me.

## 2015-01-13 NOTE — ED Notes (Signed)
Dr. Hughes reviewed discharge instructions with the patient.  The patient verbalized understanding.  All questions and concerns were addressed.  The patient is discharged ambulatory in the care of family members with instructions and prescriptions in hand.  Pt is alert and oriented x 4.  Respirations are clear and unlabored.

## 2015-01-13 NOTE — Progress Notes (Signed)
1337 order received from Dr. Ignacia Bayley: use clinic orders for now. Pt is a chronic HD patient of [facility]. Will transcribe order

## 2015-01-13 NOTE — ED Notes (Signed)
Fleet Contras from dialysis unit called with questions regarding patient's ED visit. Fleet Contras stated that they "did him on Friday and he is back today." Fleet Contras stated to RN "they [facility] jail that can do his dialysis." Fleet Contras speaking with Deputy Mould from [place] at this time regarding further questions. Per Deputy Lennox Solders from dialysis to call and speak with jail staff directly.

## 2015-01-13 NOTE — ED Notes (Signed)
Patient returned from dialysis.  Pts BP 210/112 and 205/110.  Pt reported he hasn't had any of his BP medications since he was taken to jail.  Pt given hydralazine and clonidine while in dialysis.  Pt also takes amlodipine at home. Will discuss with Dr. Kizzie Bane.

## 2015-01-13 NOTE — ED Notes (Signed)
Patient OTF to dialysis.
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
## 2015-01-14 MED ORDER — AMLODIPINE 10 MG TAB
10 mg | ORAL_TABLET | Freq: Every day | ORAL | 0 refills | Status: DC
Start: 2015-01-14 — End: 2020-03-05

## 2015-01-14 MED ORDER — CLONIDINE 0.2 MG TAB
0.2 mg | ORAL_TABLET | Freq: Two times a day (BID) | ORAL | 0 refills | Status: DC
Start: 2015-01-14 — End: 2017-01-28

## 2015-01-14 MED ORDER — HYDRALAZINE 100 MG TAB
100 mg | ORAL_TABLET | Freq: Two times a day (BID) | ORAL | 0 refills | Status: AC
Start: 2015-01-14 — End: ?

## 2016-11-07 ENCOUNTER — Inpatient Hospital Stay
Admit: 2016-11-07 | Discharge: 2016-11-07 | Disposition: A | Discharge status: Home / Self Care | Payer: MEDICARE | Attending: Emergency Medicine

## 2016-11-07 DIAGNOSIS — J209 Acute bronchitis, unspecified: Secondary | ICD-10-CM

## 2016-11-07 MED ORDER — IPRATROPIUM 20 MCG-ALBUTEROL 100 MCG/ACTUATION MIST FOR INHALATION
20-100 mcg/actuation | Freq: Four times a day (QID) | RESPIRATORY_TRACT | 0 refills | Status: DC | PRN
Start: 2016-11-07 — End: 2017-01-28

## 2016-11-07 MED ORDER — DEXAMETHASONE SODIUM PHOSPHATE 10 MG/ML IJ SOLN
10 mg/mL | INTRAMUSCULAR | Status: AC
Start: 2016-11-07 — End: 2016-11-07
  Administered 2016-11-07: 12:00:00 via INTRAMUSCULAR

## 2016-11-07 MED ORDER — IPRATROPIUM-ALBUTEROL 2.5 MG-0.5 MG/3 ML NEB SOLUTION
2.5 mg-0.5 mg/3 ml | RESPIRATORY_TRACT | Status: AC
Start: 2016-11-07 — End: 2016-11-07
  Administered 2016-11-07: 12:00:00 via RESPIRATORY_TRACT

## 2016-11-07 MED FILL — IPRATROPIUM-ALBUTEROL 2.5 MG-0.5 MG/3 ML NEB SOLUTION: 2.5 mg-0.5 mg/3 ml | RESPIRATORY_TRACT | Qty: 3

## 2016-11-07 MED FILL — DEXAMETHASONE SODIUM PHOSPHATE 10 MG/ML IJ SOLN: 10 mg/mL | INTRAMUSCULAR | Qty: 1

## 2016-11-07 NOTE — ED Triage Notes (Addendum)
Pt here for evaluation of B/P + hx of dialysis, DM, and HTN. + non productive cough. Left upper arm AVG. + bruit & thrill

## 2016-11-07 NOTE — ED Notes (Signed)
Patient presents to ED with concerns of HTN this morning.  Took BP medications PTA.  Attends HD T/R/Sa.   Patient reports occasional cough and daily smoker.     Emergency Department Nursing Plan of Care       The Nursing Plan of Care is developed from the Nursing assessment and Emergency Department Attending provider initial evaluation.  The plan of care may be reviewed in the ???ED Provider note???.    The Plan of Care was developed with the following considerations:   Patient / Family readiness to learn indicated [id number]:verbalized understanding  Persons(s) to be included in education: patient  Barriers to Learning/Limitations:No    Signed     Mirian CapuchinHayley T Buchanan, RN    11/07/2016   7:32 AM

## 2016-11-07 NOTE — ED Notes (Signed)
Discharge summary and discharge medications reviewed with patient and appropriate educational materials and side effects teaching were provided.  patient  Given 1 paper prescriptions and 0 electronic prescriptions sent to pt's listed pharmacy.Patient verbalized understanding of the importance of discussing medications with his or her physician or clinic they will be following up with. No si/s of acute distress prior to discharge.Patient offered wheelchair from treatment area to hospital entrance, patient declined wheelchair escort out of ED.

## 2016-11-07 NOTE — ED Provider Notes (Signed)
EMERGENCY DEPARTMENT HISTORY AND PHYSICAL EXAM      Date: 11/07/2016  Patient Name: Ricky Kelley    History of Presenting Illness     Chief Complaint   Patient presents with   ??? Hypertension       History Provided By: Patient    HPI: Virl Son, 51 y.o. male with PMHx significant for CKD on dialysis T/Th/Sat, HTN, DM, presents ambulatory to the ED for evaluation of elevated BP readings this morning. Pt states he checked his BP using a RUE wrist cuff with readings 242/59 and 262/98 this morning PTA, which prompted him to come to the ED for evaluation. He reports he initially checked his BP due to generalized throbbing HA this morning. He endorses compliance with his BP medication. Pt further adds experiencing nonproductive cough and chronic SOB due to smoking. He states his last full dialysis treatment was yesterday and was evaluated by his PCP last week. Pt notes his PCP took him off of insulin 2 months ago because his A1C improved. He specifically denies any recent CP, dizziness, visual changes, fevers, chills, abdominal pain, N/V, or any other complaints at this time.        There are no other complaints, changes, or physical findings at this time.    PCP: Fay Records, MD    Current Outpatient Prescriptions   Medication Sig Dispense Refill   ??? carvedilol (COREG) 25 mg tablet Take 25 mg by mouth two (2) times daily (with meals).     ??? omeprazole (PRILOSEC) 40 mg capsule Take 40 mg by mouth daily.     ??? fosinopril (MONOPRIL) 10 mg tablet Take 40 mg by mouth daily.     ??? ipratropium-albuterol (COMBIVENT RESPIMAT) 20-100 mcg/actuation inhaler Take 1 Puff by inhalation every six (6) hours as needed for Wheezing. 1 Inhaler 0   ??? sucralfate (CARAFATE) 1 gram tablet Take 1 Tab by mouth four (4) times daily. 120 Tab 2   ??? gabapentin (NEURONTIN) 300 mg capsule   6   ??? RENAGEL 800 mg tablet   4   ??? cloNIDine HCl (CATAPRES) 0.2 mg tablet Take 1 Tab by mouth two (2) times a day. 60 Tab 0    ??? amLODIPine (NORVASC) 10 mg tablet Take 1 Tab by mouth daily. 30 Tab 0   ??? hydrALAZINE (APRESOLINE) 100 mg tablet Take 1 Tab by mouth two (2) times a day. 60 Tab 0   ??? OTHER by Other route every thirty (30) days. Indications: with dialysis     ??? ONETOUCH ULTRA TEST strip   0   ??? LANTUS SOLOSTAR 100 unit/mL (3 mL) pen   6   ??? pravastatin (PRAVACHOL) 10 mg tablet   10   ??? RENVELA 800 mg tab tab   4       Past History     Past Medical History:  Past Medical History:   Diagnosis Date   ??? Chronic kidney disease     dialysis MWF   ??? Diabetes (HCC)    ??? HTN (hypertension)        Past Surgical History:  Past Surgical History:   Procedure Laterality Date   ??? COLONOSCOPY N/A 11/21/2014    COLONOSCOPY performed by Levonne Spiller, MD at [facility] ENDOSCOPY   ??? VASCULAR SURGERY PROCEDURE UNLIST      dialysis shunt l arm 2012       Family History:  Family History   Problem Relation Age of Onset   ???  Hypertension Mother    ??? Hypertension Sister    ??? Heart Attack Other    ??? Diabetes Other        Social History:  Social History   Substance Use Topics   ??? Smoking status: Current Every Day Smoker     Packs/day: 0.50   ??? Smokeless tobacco: Never Used   ??? Alcohol use No       Allergies:  No Known Allergies      Review of Systems   Review of Systems   Constitutional: Negative.  Negative for activity change, appetite change, chills, fatigue, fever and unexpected weight change.   HENT: Negative.  Negative for congestion, hearing loss, rhinorrhea, sneezing and voice change.    Eyes: Negative.  Negative for pain and visual disturbance.   Respiratory: Negative.  Negative for apnea, cough, choking, chest tightness and shortness of breath.    Cardiovascular: Negative.  Negative for chest pain and palpitations.   Gastrointestinal: Negative.  Negative for abdominal distention, abdominal pain, blood in stool, diarrhea, nausea and vomiting.   Genitourinary: Negative.  Negative for difficulty urinating, flank pain, frequency and urgency.    Musculoskeletal: Negative.  Negative for arthralgias, back pain, myalgias and neck stiffness.   Skin: Negative.  Negative for color change and rash.   Neurological: Positive for headaches. Negative for dizziness, seizures, syncope, speech difficulty, weakness and numbness.   Hematological: Negative for adenopathy.   Psychiatric/Behavioral: Negative.  Negative for agitation, behavioral problems, dysphoric mood and suicidal ideas. The patient is not nervous/anxious.        Physical Exam   Physical Exam   Constitutional: He is oriented to person, place, and time. He appears well-developed and well-nourished.   HENT:   Head: Normocephalic and atraumatic.   Mouth/Throat: Oropharynx is clear and moist.   Eyes: Conjunctivae and EOM are normal. Pupils are equal, round, and reactive to light.   Neck: Normal range of motion. Neck supple.   Cardiovascular: Normal rate, regular rhythm and normal heart sounds.  Exam reveals no gallop and no friction rub.    No murmur heard.  Pulmonary/Chest: Effort normal. No respiratory distress. He has wheezes. He has rhonchi. He has no rales.   Abdominal: Soft. Bowel sounds are normal. He exhibits no distension. There is no tenderness. There is no rebound and no guarding.   Musculoskeletal: Normal range of motion. He exhibits no edema or tenderness.   Lymphadenopathy:     He has no cervical adenopathy.   Neurological: He is alert and oriented to person, place, and time. He has normal strength. No cranial nerve deficit or sensory deficit. He displays a negative Romberg sign. Coordination and gait normal.   Skin: Skin is warm and dry. No ecchymosis, no lesion and no rash noted. Rash is not urticarial. He is not diaphoretic. No erythema.   Psychiatric: He has a normal mood and affect.   Nursing note and vitals reviewed.      Diagnostic Study Results     Labs -   No results found for this or any previous visit (from the past 12 hour(s)).    Radiologic Studies -   No orders to display      Medical Decision Making   I am the first provider for this patient.    I reviewed the vital signs, available nursing notes, past medical history, past surgical history, family history and social history.    Vital Signs-Reviewed the patient's vital signs.  Patient Vitals for the past 12  hrs:   Temp Pulse Resp BP SpO2   11/07/16 0747 - - - - 99 %   11/07/16 0706 98.6 ??F (37 ??C) 71 16 159/73 98 %       Pulse Oximetry Analysis - 99% on RA      Records Reviewed: Nursing Notes and Old Medical Records    Provider Notes (Medical Decision Making):   DDx: HTN, hypertensive urgency, bronchitis, tobacco abuse    ED Course:   Initial assessment performed. The patients presenting problems have been discussed, and they are in agreement with the care plan formulated and outlined with them.  I have encouraged them to ask questions as they arise throughout their visit.    Tobacco Counseling  Discussed the risks of smoking and the benefits of smoking cessation as well as the long term sequelae of smoking with the patient. The patient verbalized their understanding.     8:55 AM  Re-evaluated pt. He states his breathing has improved with duo-neb and decadron. Will discharge with prescription. He agrees to f/u with his PCP next week.     Discharge Note:  9:00 AM  The patient has been re-evaluated and is ready for discharge. Reviewed available results with patient. Counseled patient on diagnosis and care plan. Patient has expressed understanding, and all questions have been answered. Patient agrees with plan and agrees to follow up as recommended, or to return to the ED if their symptoms worsen. Discharge instructions have been provided and explained to the patient, along with reasons to return to the ED.    PLAN:  1.   Discharge Medication List as of 11/07/2016  9:11 AM      START taking these medications    Details   ipratropium-albuterol (COMBIVENT RESPIMAT) 20-100 mcg/actuation inhaler  Take 1 Puff by inhalation every six (6) hours as needed for Wheezing., Print, Disp-1 Inhaler, R-0         CONTINUE these medications which have NOT CHANGED    Details   carvedilol (COREG) 25 mg tablet Take 25 mg by mouth two (2) times daily (with meals)., Historical Med      omeprazole (PRILOSEC) 40 mg capsule Take 40 mg by mouth daily., Historical Med      fosinopril (MONOPRIL) 10 mg tablet Take 40 mg by mouth daily., Historical Med      sucralfate (CARAFATE) 1 gram tablet Take 1 Tab by mouth four (4) times daily., Print, Disp-120 Tab, R-2      gabapentin (NEURONTIN) 300 mg capsule Historical Med, R-6      RENAGEL 800 mg tablet Historical Med, R-4      cloNIDine HCl (CATAPRES) 0.2 mg tablet Take 1 Tab by mouth two (2) times a day., Print, Disp-60 Tab, R-0      amLODIPine (NORVASC) 10 mg tablet Take 1 Tab by mouth daily., Print, Disp-30 Tab, R-0      hydrALAZINE (APRESOLINE) 100 mg tablet Take 1 Tab by mouth two (2) times a day., Print, Disp-60 Tab, R-0      OTHER by Other route every thirty (30) days. Indications: with dialysis, Historical Med      ONETOUCH ULTRA TEST strip Historical Med, R-0      LANTUS SOLOSTAR 100 unit/mL (3 mL) pen Historical Med, R-6      pravastatin (PRAVACHOL) 10 mg tablet Historical Med, R-10      RENVELA 800 mg tab tab Historical Med, R-4           2.   Follow-up Information  Follow up With Details Comments Contact Info    Fay Records, MD   [address] [place] [address]  [phone]          Return to ED if worse     Diagnosis     Clinical Impression:   1. Acute bronchitis, unspecified organism    2. Tobacco abuse    3. Essential hypertension        Attestations:    This note is prepared by Earnest Rosier, acting as Scribe for Retta Diones, MD.    The scribe's documentation has been prepared under my direction and personally reviewed by me in its entirety. I confirm that the note above accurately reflects all work, treatment, procedures, and medical decision  making performed by me.  Retta Diones, MD

## 2017-01-28 ENCOUNTER — Inpatient Hospital Stay
Admit: 2017-01-28 | Discharge: 2017-01-28 | Discharge status: Against Medical Advice | Payer: MEDICARE | Attending: Emergency Medicine

## 2017-01-28 ENCOUNTER — Emergency Department: Admit: 2017-01-28 | Payer: MEDICARE | Primary: Family Medicine

## 2017-01-28 ENCOUNTER — Inpatient Hospital Stay
Admit: 2017-01-28 | Discharge: 2017-01-28 | Disposition: A | Discharge status: Other Acute Inpatient Hospital | Payer: MEDICARE | Attending: Emergency Medicine

## 2017-01-28 DIAGNOSIS — I120 Hypertensive chronic kidney disease with stage 5 chronic kidney disease or end stage renal disease: Secondary | ICD-10-CM

## 2017-01-28 DIAGNOSIS — I160 Hypertensive urgency: Secondary | ICD-10-CM

## 2017-01-28 LAB — METABOLIC PANEL, COMPREHENSIVE
A-G Ratio: 0.9 — ABNORMAL LOW (ref 1.1–2.2)
ALT (SGPT): 22 U/L (ref 12–78)
AST (SGOT): 14 U/L — ABNORMAL LOW (ref 15–37)
Albumin: 4 g/dL (ref 3.5–5.0)
Alk. phosphatase: 89 U/L (ref 45–117)
Anion gap: 15 mmol/L (ref 5–15)
BUN/Creatinine ratio: 7 — ABNORMAL LOW (ref 12–20)
BUN: 135 MG/DL — ABNORMAL HIGH (ref 6–20)
Bilirubin, total: 0.7 MG/DL (ref 0.2–1.0)
CO2: 23 mmol/L (ref 21–32)
Calcium: 9.2 MG/DL (ref 8.5–10.1)
Chloride: 99 mmol/L (ref 97–108)
Creatinine: 20.39 MG/DL — ABNORMAL HIGH (ref 0.70–1.30)
GFR est AA: 3 mL/min/{1.73_m2} — ABNORMAL LOW (ref >60)
GFR est non-AA: 2 mL/min/{1.73_m2} — ABNORMAL LOW (ref >60)
Globulin: 4.3 g/dL — ABNORMAL HIGH (ref 2.0–4.0)
Glucose: 79 mg/dL (ref 65–100)
Potassium: 6.7 mmol/L — CR (ref 3.5–5.1)
Protein, total: 8.3 g/dL — ABNORMAL HIGH (ref 6.4–8.2)
Sodium: 137 mmol/L (ref 136–145)

## 2017-01-28 LAB — CBC WITH AUTOMATED DIFF
ABS. BASOPHILS: 0 10*3/uL (ref 0.0–0.1)
ABS. EOSINOPHILS: 0.3 10*3/uL (ref 0.0–0.4)
ABS. IMM. GRANS.: 0 10*3/uL (ref 0.00–0.04)
ABS. LYMPHOCYTES: 1.4 10*3/uL (ref 0.8–3.5)
ABS. MONOCYTES: 0.6 10*3/uL (ref 0.0–1.0)
ABS. NEUTROPHILS: 5.9 10*3/uL (ref 1.8–8.0)
ABSOLUTE NRBC: 0 10*3/uL (ref 0.00–0.01)
BASOPHILS: 0 % (ref 0–1)
EOSINOPHILS: 3 % (ref 0–7)
HCT: 33.4 % — ABNORMAL LOW (ref 36.6–50.3)
HGB: 11.2 g/dL — ABNORMAL LOW (ref 12.1–17.0)
IMMATURE GRANULOCYTES: 0 % (ref 0.0–0.5)
LYMPHOCYTES: 17 % (ref 12–49)
MCH: 29.4 PG (ref 26.0–34.0)
MCHC: 33.5 g/dL (ref 30.0–36.5)
MCV: 87.7 FL (ref 80.0–99.0)
MONOCYTES: 8 % (ref 5–13)
MPV: 11.2 FL (ref 8.9–12.9)
NEUTROPHILS: 72 % (ref 32–75)
NRBC: 0 PER 100 WBC
PLATELET: 144 10*3/uL — ABNORMAL LOW (ref 150–400)
RBC: 3.81 M/uL — ABNORMAL LOW (ref 4.10–5.70)
RDW: 16.9 % — ABNORMAL HIGH (ref 11.5–14.5)
WBC: 8.2 10*3/uL (ref 4.1–11.1)

## 2017-01-28 LAB — GLUCOSE, POC
Glucose (POC): 133 mg/dL — ABNORMAL HIGH (ref 65–100)
Glucose (POC): 138 mg/dL — ABNORMAL HIGH (ref 65–100)
Glucose (POC): 30 mg/dL — CL (ref 65–100)
Glucose (POC): 32 mg/dL — CL (ref 65–100)

## 2017-01-28 MED ORDER — AMLODIPINE 5 MG TAB
5 mg | ORAL | Status: AC
Start: 2017-01-28 — End: 2017-01-28
  Administered 2017-01-28: 15:00:00 via ORAL

## 2017-01-28 MED ORDER — CALCIUM GLUCONATE 100 MG/ML (10%) IV SOLN
100 mg/mL (10%) | INTRAVENOUS | Status: AC
Start: 2017-01-28 — End: 2017-01-28
  Administered 2017-01-28: 16:00:00 via INTRAVENOUS

## 2017-01-28 MED ORDER — CLONIDINE 0.1 MG TAB
0.1 mg | ORAL | Status: AC
Start: 2017-01-28 — End: 2017-01-28
  Administered 2017-01-28: 15:00:00 via ORAL

## 2017-01-28 MED ORDER — DEXTROSE 50% IN WATER (D50W) IV SYRG
INTRAVENOUS | Status: AC
Start: 2017-01-28 — End: 2017-01-28
  Administered 2017-01-28: 16:00:00 via INTRAVENOUS

## 2017-01-28 MED ORDER — INSULIN REGULAR HUMAN 100 UNIT/ML INJECTION
100 unit/mL | INTRAMUSCULAR | Status: AC
Start: 2017-01-28 — End: 2017-01-28
  Administered 2017-01-28: 16:00:00 via INTRAVENOUS

## 2017-01-28 MED ORDER — NITROGLYCERIN 2 % TRANSDERMAL OINTMENT
2 % | TRANSDERMAL | Status: AC
Start: 2017-01-28 — End: 2017-01-28
  Administered 2017-01-28: 15:00:00 via TOPICAL

## 2017-01-28 MED ORDER — SODIUM BICARBONATE 8.4 % (1 MEQ/ML) IV SYRG
8.4 % (1 mEq/mL) | INTRAVENOUS | Status: AC
Start: 2017-01-28 — End: 2017-01-28
  Administered 2017-01-28: 16:00:00 via INTRAVENOUS

## 2017-01-28 MED ORDER — DEXTROSE 50% IN WATER (D50W) IV SYRG
INTRAVENOUS | Status: AC
Start: 2017-01-28 — End: 2017-01-28
  Administered 2017-01-28: 17:00:00 via INTRAVENOUS

## 2017-01-28 MED FILL — SODIUM BICARBONATE 8.4 % (1 MEQ/ML) IV SYRG: 8.4 % (1 mEq/mL) | INTRAVENOUS | Qty: 50

## 2017-01-28 MED FILL — INSULIN REGULAR HUMAN 100 UNIT/ML INJECTION: 100 unit/mL | INTRAMUSCULAR | Qty: 1

## 2017-01-28 MED FILL — DEXTROSE 50% IN WATER (D50W) IV SYRG: INTRAVENOUS | Qty: 50

## 2017-01-28 MED FILL — NITRO-BID 2 % TRANSDERMAL OINTMENT: 2 % | TRANSDERMAL | Qty: 1

## 2017-01-28 MED FILL — CLONIDINE 0.1 MG TAB: 0.1 mg | ORAL | Qty: 2

## 2017-01-28 MED FILL — CALCIUM GLUCONATE 100 MG/ML (10%) IV SOLN: 100 mg/mL (10%) | INTRAVENOUS | Qty: 10

## 2017-01-28 MED FILL — AMLODIPINE 5 MG TAB: 5 mg | ORAL | Qty: 2

## 2017-01-28 NOTE — ED Notes (Addendum)
Assumed care of pt from EMS. [facility] Pt is dialysis pt that has missed last 5 dialysis appointments. Pt reports pain in back of neck and lower back. Pt's blood sugar was 30, reported by EMS. EMS [facility] and that last Blood sugar was 153. Current blood sugar is 138. Pt has edema BLE.

## 2017-01-28 NOTE — Consults (Signed)
Consultation Note    NAME: Ricky Kelley   DOB:  10/18/1965   MRN:  [id number]     Date/Time:  01/28/2017 5:24 PM    I have been asked to see this patient by Dr. Kizzie Bane  for advice/opinion re: ESRD.     Assessment :    Plan:  ESRD-MWF-[facility]  Hyperkalemia  noncompliance Plan for urgent HD.  I spoke with [facility] and they say that they should have a nurse available in about an hour.       Subjective:   CHIEF COMPLAINT:  "I missed my dialysis."    HISTORY OF PRESENT ILLNESS:     Ricky Kelley is a 51 y.o.   male who has a history of ESRD who came to the ER because he was "sick."   He can give little pertinent history.  In talking with our acute HD nurses they report that the patient used to dialyze at [facility] and recently switched to [facility].  He says that something went wrong with the paperwork.  He says that he is groggy but otherwise OK.  No N/V.  No dyspnea.  No CP.    Past Medical History:   Diagnosis Date   ??? Chronic kidney disease     dialysis MWF   ??? Diabetes (HCC)    ??? HTN (hypertension)       Past Surgical History:   Procedure Laterality Date   ??? COLONOSCOPY N/A 11/21/2014    COLONOSCOPY performed by Levonne Spiller, MD at [facility] ENDOSCOPY   ??? VASCULAR SURGERY PROCEDURE UNLIST      dialysis shunt l arm 2012     Social History   Substance Use Topics   ??? Smoking status: Current Every Day Smoker     Packs/day: 0.50   ??? Smokeless tobacco: Never Used   ??? Alcohol use No      Family History   Problem Relation Age of Onset   ??? Hypertension Mother    ??? Hypertension Sister    ??? Heart Attack Other    ??? Diabetes Other       No Known Allergies   Prior to Admission medications    Medication Sig Start Date End Date Taking? Authorizing Provider   vit Bcomp,C/folic acid/zinc (DIALYVITE 469 WITH ZINC PO) Take  by mouth.    Phys Other, MD   carvedilol (COREG) 25 mg tablet Take 25 mg by mouth two (2) times daily (with meals).    Phys Other, MD    omeprazole (PRILOSEC) 40 mg capsule Take 40 mg by mouth daily.    Phys Other, MD   fosinopril (MONOPRIL) 10 mg tablet Take 40 mg by mouth daily.    Phys Other, MD   amLODIPine (NORVASC) 10 mg tablet Take 1 Tab by mouth daily. 01/13/15   Evette Doffing, DO   hydrALAZINE (APRESOLINE) 100 mg tablet Take 1 Tab by mouth two (2) times a day. 01/13/15   Evette Doffing, DO   sucralfate (CARAFATE) 1 gram tablet Take 1 Tab by mouth four (4) times daily. 11/21/14   Levonne Spiller, MD   ONETOUCH ULTRA TEST strip  07/25/13   Phys Other, MD   gabapentin (NEURONTIN) 300 mg capsule  05/31/13   Phys Other, MD   RENAGEL 800 mg tablet  05/04/13   Phys Other, MD     REVIEW OF SYSTEMS:       Unable to obtain reliable ROS due to   mental status   sedated     intubated   [x]  Total of 12 systems reviewed as follows:  Constitutional: negative fever, negative chills, negative weight loss  Eyes:   negative visual changes  ENT:   negative sore throat, tongue or lip swelling  Respiratory:  negative cough, negative dyspnea  Cards:  negative for chest pain, palpitations, lower extremity edema  GI:   negative for nausea, vomiting, diarrhea, and abdominal pain  GU:  negative for frequency, dysuria  Integument:  negative for rash and pruritus  Heme:  negative for easy bruising and gum/nose bleeding  Musculoskel: negative for myalgias,  back pain and muscle weakness  Neuro:  negative for headaches, dizziness, vertigo  Psych:  negative for feelings of anxiety, depression   Travel?: none    Objective:   VITALS:    Visit Vitals   ??? BP (!) 184/94   ??? Pulse (!) 57   ??? Resp 14   ??? Ht 5\' 6"  (1.676 m)   ??? Wt 74.8 kg (165 lb)   ??? SpO2 96%   ??? BMI 26.63 kg/m2     PHYSICAL EXAM:  Gen:  []   WD []   WN  []  cachectic []   thin []   obese []   disheveled             [x]   ill apearing  []    Critical  [x]    Chronic    [x]   No acute distress    HEENT:   []  NC/AT/PERRLA/EOMI    []  pink conjunctivae      []  pale conjunctivae                   PERRL  []  yes  []  no      []  moist mucosa    []  dry mucosa    hearing intact to voice []  yes  []  No                 NECK:   supple []  yes  []  no        masses []  yes  []  No               thyroid  []   non tender  []   tender    RESP:   [x]  CTA bilaterally/no wheezing/rhonchi/rales/crackles    []  rhonchi bilaterally - no dullness  []  wheezing   []  rhonchi   []  crackles     use of accessory muscles []  yes []  no    CARD:   [x]   regular rate and rhythm/No murmurs/rubs/gallops    murmur  []  yes ()  []  no      Rubs  []  yes  [x]  no       Gallops []  yes  []  no    Rate []   regular  []   irregular        carotid bruits  []  Right  []   Left                 LE edema []  yes  [x]  no           JVP  []   yes   []   no    ABD:    [x]  soft/non distended/non tender/+bowel sounds/no HSM    []   Rigid    tenderness []  yes []  no   Liver enlargement  []   yes []   no                Spleen enlargement  []   yes []   no  distended   yes  no     bowel sound   hypoactive    hyperactive    LYMPH:    Neck   yes   no       Axillae   yes   no    SKIN:   Rashes   yes     no    Ulcers   yes     no                tight to palpitation    skin turgor   good   poor   decreased               Cyanosis/clubbing   yes   no    NEUR:    cranial nerves II-XII grossly intact        Cranial nerves deficit                   facial droop      slurred speech    aphasic      Strength normal       weakness    LUE     RUE/   LLE     RLE    follows commands    yes   no           PSYCH:   insight  poor  good   Alert and Oriented to   person   place    time                     depressed  anxious  agitated   lethargic  stuporous   sedated     LAB DATA REVIEWED:    Recent Labs      01/28/17   1036   WBC  8.2   HGB  11.2*   HCT  33.4*   PLT  144*     Recent Labs      01/28/17   1036   NA  137   K  6.7*   CL  99   CO2  23   BUN  135*   CREA  20.39*   GLU  79   CA  9.2      Recent Labs      01/28/17   1036   SGOT  14*   ALT  22   AP  89   TBILI  0.7   ALB  4.0   GLOB  4.3*     No results for input(s): INR, PTP, APTT in the last 72 hours.    No lab exists for component: INREXT   No results for input(s): FE, TIBC, PSAT, FERR in the last 72 hours.   No results for input(s): PH, PCO2, PO2 in the last 72 hours.  No results for input(s): CPK, CKMB in the last 72 hours.    No lab exists for component: TROPONINI  Lab Results   Component Value Date/Time    Glucose (POC) 138 (H) 01/28/2017 02:17 PM    Glucose (POC) 133 (H) 01/28/2017 01:23 PM    Glucose (POC) 32 (LL) 01/28/2017 01:15 PM    Glucose (POC) 30 (LL) 01/28/2017 01:13 PM    Glucose (POC) 198 (H) 01/13/2015 05:51 PM       Procedures: see electronic medical records for all procedures/Xrays and details which were not copied into this note but were reviewed prior to creation of Plan.    ________________________________________________________________________  ___________________________________________________  Consulting Physician: Rosalia Hammers, MD

## 2017-01-28 NOTE — ED Notes (Signed)
Dr. Mason Jim spoke with admitting MD at [facility] concerning hypoglycemic event that occurred and pt treated for BG 30. Dextrose 25g given IV.

## 2017-01-28 NOTE — ED Triage Notes (Signed)
Patient presents via [facility] from home with concerns of fluid retention and HTN after missing nearly one week of HD.  T/R/Sa hemodialysis.  LUE AV fistula.   CBG 77 en route.  Has not taken am medications today.

## 2017-01-28 NOTE — Other (Signed)
1845 in suite preparing machine to take to Emergency Room and received call from ER nurse patient had signed out AMA.

## 2017-01-28 NOTE — ED Notes (Signed)
Chest portable at bedside and completed.

## 2017-01-28 NOTE — ED Notes (Signed)
Pt discharged by MD/PA with discharge paperwork, pt a/o x 4 at time of d/c.

## 2017-01-28 NOTE — ED Notes (Addendum)
Per pt reports BLE swelling, intermittent, aching, left sided chest pain that started today and sob. Pt reports missing dialysis for two weeks, is currently a patient with [facility], awaiting to be accepted pending lab work. Schedule for dialysis is Tue/Thur/Sat.   Emergency Department Nursing Plan of Care       The Nursing Plan of Care is developed from the Nursing assessment and Emergency Department Attending provider initial evaluation.  The plan of care may be reviewed in the ???ED Provider note???.    The Plan of Care was developed with the following considerations:   Patient / Family readiness to learn indicated NW:[id number] understanding  Persons(s) to be included in education: patient  Barriers to Learning/Limitations:No    Signed     Betsey Amen, RN    01/28/2017   10:47 AM

## 2017-01-28 NOTE — ED Notes (Signed)
Pt requests to leave AMA. Discharged by MD.

## 2017-01-28 NOTE — ED Provider Notes (Signed)
EMERGENCY DEPARTMENT HISTORY AND PHYSICAL EXAM      Date: 01/28/2017  Patient Name: Ricky Kelley    History of Presenting Illness     Chief Complaint   Patient presents with   ??? Leg Swelling     BLE, missed 5 sessions of dialysis, transfer from [facility]       History Provided By: Patient and EMS    HPI: Ricky Kelley, 51 y.o. male with PMHx significant for CKD, diabetes, and HTN, presents via EMS (transfer from [facility]) to the ED with cc of new onset, worsening of BLE swelling beginning 2 weeks ago. Pt reports he missed 5 sessions (past 2 weeks) of dialysis and also has not taken any of his prescribed medications today. Pt normally goes to dialysis on M/W/F. Pt is in the process of switching dialysis clinics, but was rejected from new clinic yesterday since they "did not have his blood work." Pt visited ED today for dialysis.  Pt's EKG was taken at [facility] and shows peaked T waves. Pt endorses tobacco (1ppd) and drug (cocaine) use, but denies EtOH consumption. Pt specifically denies SOB, CP, nausea, vomiting, abdominal pain, back pain.    Social Hx: + (0.5ppd) Tobacco, - EtOH, + (cocaine) Illicit Drugs    There are no other complaints, changes, or physical findings at this time.    PCP: Ricky Ream, MD    Current Outpatient Prescriptions   Medication Sig Dispense Refill   ??? vit Bcomp,C/folic acid/zinc (DIALYVITE 800 WITH ZINC PO) Take  by mouth.     ??? carvedilol (COREG) 25 mg tablet Take 25 mg by mouth two (2) times daily (with meals).     ??? omeprazole (PRILOSEC) 40 mg capsule Take 40 mg by mouth daily.     ??? fosinopril (MONOPRIL) 10 mg tablet Take 40 mg by mouth daily.     ??? amLODIPine (NORVASC) 10 mg tablet Take 1 Tab by mouth daily. 30 Tab 0   ??? hydrALAZINE (APRESOLINE) 100 mg tablet Take 1 Tab by mouth two (2) times a day. 60 Tab 0   ??? sucralfate (CARAFATE) 1 gram tablet Take 1 Tab by mouth four (4) times daily. 120 Tab 2   ??? ONETOUCH ULTRA TEST strip   0    ??? gabapentin (NEURONTIN) 300 mg capsule   6   ??? RENAGEL 800 mg tablet   4       Past History     Past Medical History:  Past Medical History:   Diagnosis Date   ??? Chronic kidney disease     dialysis MWF   ??? Diabetes ([place])    ??? HTN (hypertension)        Past Surgical History:  Past Surgical History:   Procedure Laterality Date   ??? COLONOSCOPY N/A 11/21/2014    COLONOSCOPY performed by Blima Rich, MD at [facility] ENDOSCOPY   ??? VASCULAR SURGERY PROCEDURE UNLIST      dialysis shunt l arm 2012       Family History:  Family History   Problem Relation Age of Onset   ??? Hypertension Mother    ??? Hypertension Sister    ??? Heart Attack Other    ??? Diabetes Other        Social History:  Social History   Substance Use Topics   ??? Smoking status: Current Every Day Smoker     Packs/day: 0.50   ??? Smokeless tobacco: Never Used   ??? Alcohol  use No       Allergies:  No Known Allergies      Review of Systems   Review of Systems   Constitutional: Negative for appetite change, chills, fatigue and fever.   HENT: Negative.  Negative for congestion, rhinorrhea, sinus pressure and sore throat.    Eyes: Negative.    Respiratory: Negative.  Negative for cough, choking, chest tightness, shortness of breath and wheezing.    Cardiovascular: Positive for leg swelling (bilateral LE). Negative for chest pain and palpitations.   Gastrointestinal: Negative for abdominal pain, constipation, diarrhea, nausea and vomiting.   Endocrine: Negative.    Genitourinary: Negative.  Negative for difficulty urinating, dysuria, flank pain and urgency.   Musculoskeletal: Negative.    Skin: Negative.    Neurological: Positive for weakness (generalized fatigue). Negative for dizziness, speech difficulty, light-headedness, numbness and headaches.   Psychiatric/Behavioral: Negative.    All other systems reviewed and are negative.      Physical Exam   Physical Exam   Constitutional: He is oriented to person, place, and time. He appears  well-developed and well-nourished. No distress.   HENT:   Head: Normocephalic and atraumatic.   Mouth/Throat: Oropharynx is clear and moist. No oropharyngeal exudate.   Eyes: Conjunctivae and EOM are normal. Pupils are equal, round, and reactive to light.   Neck: Normal range of motion. Neck supple. No JVD present. No tracheal deviation present.   Cardiovascular: Normal rate, regular rhythm, normal heart sounds and intact distal pulses.    No murmur heard.  Pulmonary/Chest: Effort normal and breath sounds normal. No stridor. No respiratory distress. He has no wheezes. He has no rales. He exhibits no tenderness.   Abdominal: Soft. He exhibits no distension. There is no tenderness. There is no rebound and no guarding.   Musculoskeletal: Normal range of motion. He exhibits no edema or tenderness.   Dialysis graft present in left upper extremity   Neurological: He is alert and oriented to person, place, and time. No cranial nerve deficit.   No gross motor or sensory deficits    Skin: Skin is warm and dry. He is not diaphoretic.   Psychiatric: He has a normal mood and affect. His behavior is normal.   Nursing note and vitals reviewed.      Diagnostic Study Results     Labs -     Recent Results (from the past 12 hour(s))   EKG, 12 LEAD, INITIAL    Collection Time: 01/28/17 10:28 AM   Result Value Ref Range    Ventricular Rate 77 BPM    Atrial Rate 77 BPM    P-R Interval 154 ms    QRS Duration 88 ms    Q-T Interval 396 ms    QTC Calculation (Bezet) 448 ms    Calculated P Axis 65 degrees    Calculated R Axis -11 degrees    Calculated T Axis 107 degrees    Diagnosis       Normal sinus rhythm  Possible Left atrial enlargement  Left ventricular hypertrophy  T wave abnormality, consider lateral ischemia  Abnormal ECG  No previous ECGs available     CBC WITH AUTOMATED DIFF    Collection Time: 01/28/17 10:36 AM   Result Value Ref Range    WBC 8.2 4.1 - 11.1 K/uL    RBC 3.81 (L) 4.10 - 5.70 M/uL    HGB 11.2 (L) 12.1 - 17.0 g/dL     HCT 33.4 (L) 36.6 - 50.3 %  MCV 87.7 80.0 - 99.0 FL    MCH 29.4 26.0 - 34.0 PG    MCHC 33.5 30.0 - 36.5 g/dL    RDW 16.9 (H) 11.5 - 14.5 %    PLATELET 144 (L) 150 - 400 K/uL    MPV 11.2 8.9 - 12.9 FL    NRBC 0.0 0 PER 100 WBC    ABSOLUTE NRBC 0.00 0.00 - 0.01 K/uL    NEUTROPHILS 72 32 - 75 %    LYMPHOCYTES 17 12 - 49 %    MONOCYTES 8 5 - 13 %    EOSINOPHILS 3 0 - 7 %    BASOPHILS 0 0 - 1 %    IMMATURE GRANULOCYTES 0 0.0 - 0.5 %    ABS. NEUTROPHILS 5.9 1.8 - 8.0 K/UL    ABS. LYMPHOCYTES 1.4 0.8 - 3.5 K/UL    ABS. MONOCYTES 0.6 0.0 - 1.0 K/UL    ABS. EOSINOPHILS 0.3 0.0 - 0.4 K/UL    ABS. BASOPHILS 0.0 0.0 - 0.1 K/UL    ABS. IMM. GRANS. 0.0 0.00 - 0.04 K/UL    DF AUTOMATED     METABOLIC PANEL, COMPREHENSIVE    Collection Time: 01/28/17 10:36 AM   Result Value Ref Range    Sodium 137 136 - 145 mmol/L    Potassium 6.7 (HH) 3.5 - 5.1 mmol/L    Chloride 99 97 - 108 mmol/L    CO2 23 21 - 32 mmol/L    Anion gap 15 5 - 15 mmol/L    Glucose 79 65 - 100 mg/dL    BUN 135 (H) 6 - 20 MG/DL    Creatinine 20.39 (H) 0.70 - 1.30 MG/DL    BUN/Creatinine ratio 7 (L) 12 - 20      GFR est AA 3 (L) >60 ml/min/1.70m    GFR est non-AA 2 (L) >60 ml/min/1.764m   Calcium 9.2 8.5 - 10.1 MG/DL    Bilirubin, total 0.7 0.2 - 1.0 MG/DL    ALT (SGPT) 22 12 - 78 U/L    AST (SGOT) 14 (L) 15 - 37 U/L    Alk. phosphatase 89 45 - 117 U/L    Protein, total 8.3 (H) 6.4 - 8.2 g/dL    Albumin 4.0 3.5 - 5.0 g/dL    Globulin 4.3 (H) 2.0 - 4.0 g/dL    A-G Ratio 0.9 (L) 1.1 - 2.2     GLUCOSE, POC    Collection Time: 01/28/17  1:13 PM   Result Value Ref Range    Glucose (POC) 30 (LL) 65 - 100 mg/dL    Performed by AuCarollee Sires    GLUCOSE, POC    Collection Time: 01/28/17  1:15 PM   Result Value Ref Range    Glucose (POC) 32 (LL) 65 - 100 mg/dL    Performed by AuCarollee Sires    GLUCOSE, POC    Collection Time: 01/28/17  1:23 PM   Result Value Ref Range    Glucose (POC) 133 (H) 65 - 100 mg/dL    Performed by AuCarollee Sires    GLUCOSE, POC     Collection Time: 01/28/17  2:17 PM   Result Value Ref Range    Glucose (POC) 138 (H) 65 - 100 mg/dL    Performed by MeVicente MassonPCT)        Radiologic Studies -     CXR Results  (Last 48 hours)  01/28/17 1045  XR CHEST PORT Final result    Impression:  IMPRESSION: Cardiomegaly and vascular congestion.           Narrative:  INDICATION:  SOB, dialysis patient, no HD for 2 weeks, volume overload?       COMPARISON:  Abdominal and pelvic x-ray scout 08/02/13       FINDINGS:    A portable AP radiograph of the chest was obtained at 1038 hours.    The patient is on a cardiac monitor.      Suboptimal inspiration accentuates heart size and pulmonary markings..   There appears to be mild cardiomegaly.    Pulmonary vascular prominence and congestion with diffuse interstitial   prominence and possible developing airspace disease.   Possible small right pleural effusion.                   Medical Decision Making   I am the first provider for this patient.    I reviewed the vital signs, available nursing notes, past medical history, past surgical history, family history and social history.    Vital Signs-Reviewed the patient's vital signs.  Patient Vitals for the past 12 hrs:   Pulse Resp BP SpO2   01/28/17 1645 (!) 57 14 (!) 184/94 96 %   01/28/17 1630 (!) 56 12 160/87 92 %   01/28/17 1615 (!) 55 13 178/80 95 %   01/28/17 1600 (!) 54 23 156/74 94 %   01/28/17 1545 (!) 56 11 160/86 94 %   01/28/17 1530 (!) 56 12 155/89 95 %   01/28/17 1515 (!) 56 12 152/85 94 %   01/28/17 1500 (!) 58 11 160/83 95 %   01/28/17 1445 (!) 59 12 166/88 96 %   01/28/17 1430 (!) 58 12 (!) 173/95 96 %   01/28/17 1415 61 16 171/90 97 %   01/28/17 1411 60 17 171/90 97 %       Pulse Oximetry Analysis - 97% on RA    Cardiac Monitor:   Rate: 60 bpm  Rhythm: Normal Sinus Rhythm        Records Reviewed: Nursing Notes, Old Medical Records, Ambulance Run Sheet and Previous Laboratory Studies    Provider Notes (Medical Decision Making):    Pt was referred for dialysis.     ED Course:   Initial assessment performed. The patients presenting problems have been discussed, and they are in agreement with the care plan formulated and outlined with them.  I have encouraged them to ask questions as they arise throughout their visit.    Upon arrival pt evaluated and Nephrology was consulted.     Consult Note:  3:50 PM  Rada Hay, DO spoke with Hulda Humphrey, MD,  Specialty: nephrology  Discussed pt's hx, disposition, and available diagnostic and imaging results. Reviewed care plans. Consultant agrees with plans as outlined. Dr. Shirlee Limerick evaluated pt and agreed to dialysis.    PROGRESS NOTE:  6:21 PM  Pt had not had dialysis, Dialysis with unknown time to be done.   Pt cannot stay for dialysis since he needs to be at work. Offered to call work, but pt refused since his work doesn't know he does dialysis and does not want them to know. Will proceed with discharge. Pt in no acute distress.   Written by Thamas Jaegers ED Scribe as dictated by Rada Hay, DO    Critical Care Time: 0 minutes    Disposition:  6:22 PM  ???  I informed the pt that he needed dialysis for adequate treatment for his bilateral leg swelling, and that by refusing the above, he is at risk for ., myocardial infarction, paralysis, further deterioration, coma.  He is awake, alert, and he understands his condition and the risks involved in leaving.     The patient has received no analgesics, benzos, sedatives, etc.     He is clinically aware of his surroundings and able to ask appropriate questions, the patient's nurse is present and confirms he is not clinically intoxicated and able to make medical decisions. He verbalized knowing the risks and understood it was recommended that he stay and could also return at any time.  He was provided with warnings regarding worsening of his condition and provided instructions to follow up with his  Nephrologist at [facility] or return to the Emergency Room as soon as possible. This discussion was witnessed by nurse Lolita Patella.      PLAN:  1. Discharge Home    Diagnosis     Clinical Impression:   1. Acute hyperkalemia    2. Uremia    3. ESRD on hemodialysis [facility])        Attestations:    This note is prepared by Thamas Jaegers, acting as Scribe for Rada Hay, DO.    The scribe's documentation has been prepared under my direction and personally reviewed by me in its entirety. I confirm that the note above accurately reflects all work, treatment, procedures, and medical decision making performed by me.  Rada Hay, DO

## 2017-01-28 NOTE — ED Provider Notes (Addendum)
EMERGENCY DEPARTMENT HISTORY AND PHYSICAL EXAM      Date: 01/28/2017  Patient Name: Ricky Kelley    History of Presenting Illness     Chief Complaint   Patient presents with   ??? Hypertension       History Provided By: Patient    HPI: Ricky Kelley, 51 y.o. male with PMHx significant for CKD, DM, HTN, presents via EMS to the ED with cc of worsening bilateral leg swelling due to missed dialysis for the past 2 weeks. He is a Monday/wednesday/friday dialysis patient. Pt was in the process of switching dialysis clinics. During this process he has not been to a dialysis treatment over the past 2 weeks. He tried to get into his new dialysis clinic yesterday but was rejected due to them "not having his blood work". Today he reports his leg swelling is worse and he cannot wait for the center to approve his dialysis. His nephrologist is Dr. Mathews Argyle who he has not seen during this period of time. Of note his BP in the room during H&P is 230/99, he does have a hx of HTN and is on medications for it. He denies any SOB, CP, nausea, vomiting, abdominal pain, back pain. His main reason for coming into the ED today was for dialysis.     Social Hx: + Tobacco (0.5 ppd), - EtOH (-), + illicit drug use (cocaine)    There are no other complaints, changes, or physical findings at this time.    PCP: Buel Ream, MD    Current Facility-Administered Medications   Medication Dose Route Frequency Provider Last Rate Last Dose   ??? insulin regular (NOVOLIN R, HUMULIN R) injection 10 Units  10 Units IntraVENous NOW Luther Hearing, MD       ??? dextrose (D50W) injection syrg 25 g  50 mL IntraVENous NOW Luther Hearing, MD       ??? calcium gluconate injection 1 g  1 g IntraVENous NOW Luther Hearing, MD       ??? sodium bicarbonate 8.4 % (1 mEq/mL) injection 50 mEq  50 mEq IntraVENous NOW Luther Hearing, MD         Current Outpatient Prescriptions   Medication Sig Dispense Refill    ??? vit Bcomp,C/folic acid/zinc (DIALYVITE 800 WITH ZINC PO) Take  by mouth.     ??? carvedilol (COREG) 25 mg tablet Take 25 mg by mouth two (2) times daily (with meals).     ??? omeprazole (PRILOSEC) 40 mg capsule Take 40 mg by mouth daily.     ??? fosinopril (MONOPRIL) 10 mg tablet Take 40 mg by mouth daily.     ??? amLODIPine (NORVASC) 10 mg tablet Take 1 Tab by mouth daily. 30 Tab 0   ??? hydrALAZINE (APRESOLINE) 100 mg tablet Take 1 Tab by mouth two (2) times a day. 60 Tab 0   ??? sucralfate (CARAFATE) 1 gram tablet Take 1 Tab by mouth four (4) times daily. 120 Tab 2   ??? gabapentin (NEURONTIN) 300 mg capsule   6   ??? RENAGEL 800 mg tablet   4   ??? ONETOUCH ULTRA TEST strip   0       Past History     Past Medical History:  Past Medical History:   Diagnosis Date   ??? Chronic kidney disease     dialysis MWF   ??? Diabetes ([place])    ??? HTN (hypertension)  Past Surgical History:  Past Surgical History:   Procedure Laterality Date   ??? COLONOSCOPY N/A 11/21/2014    COLONOSCOPY performed by Blima Rich, MD at [facility] ENDOSCOPY   ??? VASCULAR SURGERY PROCEDURE UNLIST      dialysis shunt l arm 2012       Family History:  Family History   Problem Relation Age of Onset   ??? Hypertension Mother    ??? Hypertension Sister    ??? Heart Attack Other    ??? Diabetes Other        Social History:  Social History   Substance Use Topics   ??? Smoking status: Current Every Day Smoker     Packs/day: 0.50   ??? Smokeless tobacco: Never Used   ??? Alcohol use No       Allergies:  No Known Allergies      Review of Systems   Review of Systems   Constitutional: Negative for chills and fever.   HENT: Negative for congestion, rhinorrhea, sneezing and sore throat.    Eyes: Negative for redness and visual disturbance.   Respiratory: Negative for shortness of breath.    Cardiovascular: Positive for leg swelling. Negative for chest pain.   Gastrointestinal: Negative for abdominal pain, nausea and vomiting.   Genitourinary: Negative for difficulty urinating and frequency.    Musculoskeletal: Negative for back pain, myalgias and neck stiffness.   Skin: Negative for rash.   Neurological: Negative for dizziness, syncope, weakness and headaches.   Hematological: Negative for adenopathy.   All other systems reviewed and are negative.      Physical Exam   Physical Exam   Constitutional: He is oriented to person, place, and time. He appears well-developed and well-nourished.   Smells of tobacco.    HENT:   Head: Normocephalic and atraumatic.   Mouth/Throat: Oropharynx is clear and moist and mucous membranes are normal.   Eyes: EOM are normal.   Neck: Normal range of motion and full passive range of motion without pain. Neck supple.   Cardiovascular: Normal rate, regular rhythm, normal heart sounds, intact distal pulses and normal pulses.    No murmur heard.  Pulmonary/Chest: Effort normal and breath sounds normal. No respiratory distress. He exhibits no tenderness.   Abdominal: Soft. Normal appearance and bowel sounds are normal. There is no tenderness. There is no rebound and no guarding.   Musculoskeletal: Normal range of motion. He exhibits edema. He exhibits no tenderness or deformity.   Trace bilateral lower extremity edema.    Neurological: He is alert and oriented to person, place, and time. He has normal strength.   Skin: Skin is warm, dry and intact. No rash noted. No erythema.   Left upper extremity dialysis fistula with palpable thrill.    Psychiatric: He has a normal mood and affect. His speech is normal and behavior is normal. Judgment and thought content normal.   Nursing note and vitals reviewed.      Diagnostic Study Results     Labs -     Recent Results (from the past 12 hour(s))   EKG, 12 LEAD, INITIAL    Collection Time: 01/28/17 10:28 AM   Result Value Ref Range    Ventricular Rate 77 BPM    Atrial Rate 77 BPM    P-R Interval 154 ms    QRS Duration 88 ms    Q-T Interval 396 ms    QTC Calculation (Bezet) 448 ms    Calculated P Axis 65 degrees  Calculated R Axis -11 degrees    Calculated T Axis 107 degrees    Diagnosis       Normal sinus rhythm  Possible Left atrial enlargement  Left ventricular hypertrophy  T wave abnormality, consider lateral ischemia  Abnormal ECG  No previous ECGs available     CBC WITH AUTOMATED DIFF    Collection Time: 01/28/17 10:36 AM   Result Value Ref Range    WBC 8.2 4.1 - 11.1 K/uL    RBC 3.81 (L) 4.10 - 5.70 M/uL    HGB 11.2 (L) 12.1 - 17.0 g/dL    HCT 33.4 (L) 36.6 - 50.3 %    MCV 87.7 80.0 - 99.0 FL    MCH 29.4 26.0 - 34.0 PG    MCHC 33.5 30.0 - 36.5 g/dL    RDW 16.9 (H) 11.5 - 14.5 %    PLATELET 144 (L) 150 - 400 K/uL    MPV 11.2 8.9 - 12.9 FL    NRBC 0.0 0 PER 100 WBC    ABSOLUTE NRBC 0.00 0.00 - 0.01 K/uL    NEUTROPHILS 72 32 - 75 %    LYMPHOCYTES 17 12 - 49 %    MONOCYTES 8 5 - 13 %    EOSINOPHILS 3 0 - 7 %    BASOPHILS 0 0 - 1 %    IMMATURE GRANULOCYTES 0 0.0 - 0.5 %    ABS. NEUTROPHILS 5.9 1.8 - 8.0 K/UL    ABS. LYMPHOCYTES 1.4 0.8 - 3.5 K/UL    ABS. MONOCYTES 0.6 0.0 - 1.0 K/UL    ABS. EOSINOPHILS 0.3 0.0 - 0.4 K/UL    ABS. BASOPHILS 0.0 0.0 - 0.1 K/UL    ABS. IMM. GRANS. 0.0 0.00 - 0.04 K/UL    DF AUTOMATED     METABOLIC PANEL, COMPREHENSIVE    Collection Time: 01/28/17 10:36 AM   Result Value Ref Range    Sodium 137 136 - 145 mmol/L    Potassium 6.7 (HH) 3.5 - 5.1 mmol/L    Chloride 99 97 - 108 mmol/L    CO2 23 21 - 32 mmol/L    Anion gap 15 5 - 15 mmol/L    Glucose 79 65 - 100 mg/dL    BUN 135 (H) 6 - 20 MG/DL    Creatinine 20.39 (H) 0.70 - 1.30 MG/DL    BUN/Creatinine ratio 7 (L) 12 - 20      GFR est AA 3 (L) >60 ml/min/1.15m    GFR est non-AA 2 (L) >60 ml/min/1.728m   Calcium 9.2 8.5 - 10.1 MG/DL    Bilirubin, total 0.7 0.2 - 1.0 MG/DL    ALT (SGPT) 22 12 - 78 U/L    AST (SGOT) 14 (L) 15 - 37 U/L    Alk. phosphatase 89 45 - 117 U/L    Protein, total 8.3 (H) 6.4 - 8.2 g/dL    Albumin 4.0 3.5 - 5.0 g/dL    Globulin 4.3 (H) 2.0 - 4.0 g/dL    A-G Ratio 0.9 (L) 1.1 - 2.2         Radiologic Studies -    CXR Results  (Last 48 hours)               01/28/17 1045  XR CHEST PORT Final result    Impression:  IMPRESSION: Cardiomegaly and vascular congestion.           Narrative:  INDICATION:  SOB, dialysis patient, no HD for 2 weeks,  volume overload?       COMPARISON:  Abdominal and pelvic x-ray scout 08/02/13       FINDINGS:    A portable AP radiograph of the chest was obtained at 1038 hours.    The patient is on a cardiac monitor.      Suboptimal inspiration accentuates heart size and pulmonary markings..   There appears to be mild cardiomegaly.    Pulmonary vascular prominence and congestion with diffuse interstitial   prominence and possible developing airspace disease.   Possible small right pleural effusion.                   Medical Decision Making   I am the first provider for this patient.    I reviewed the vital signs, available nursing notes, past medical history, past surgical history, family history and social history.    Vital Signs-Reviewed the patient's vital signs.  Patient Vitals for the past 12 hrs:   Temp Pulse Resp BP SpO2   01/28/17 1130 - 72 18 (!) 223/108 97 %   01/28/17 1100 - 71 17 (!) 221/100 97 %   01/28/17 1030 - - - (!) 238/124 97 %   01/28/17 1025 - - - (!) 230/99 100 %   01/28/17 1018 98.5 ??F (36.9 ??C) 80 20 (!) 238/124 96 %     Records Reviewed: Nursing Notes, Old Medical Records, Previous Radiology Studies and Previous Laboratory Studies    Provider Notes (Medical Decision Making):   DDx: Volume overload, electrolyte abnormality, noncompliance with medications/treatment.     ED Course:   Initial assessment performed. The patients presenting problems have been discussed, and they are in agreement with the care plan formulated and outlined with them.  I have encouraged them to ask questions as they arise throughout their visit.    Highly suspect pt will need transfer for emergent dialysis. Will await CXR and metabolic panel and will discuss with [facility] ED.    ED EKG interpretation: 10:28   Rhythm: normal sinus rhythm; and regular . Rate (approx.): 77; Axis: normal; P wave: normal; QRS interval: normal ; ST/T wave: Peaked T waves; Other findings: left ventricular hypertrophy. This EKG was interpreted by Luther Hearing, MD,ED Provider.    PROGRESS NOTE:  11:02 AM  Pt did not take any of his prescribed medications today, will need to order some. Since pt does not make urine I cannot check a UDS, he does have a hx of cocaine use in the past.     PROGRESS NOTE:  11:11 AM  Pt's potassium is 6.7, also with peaked T waves on EKG. Will call transfer center for [facility]. Have ordered Ca2+, insulin, glucose, and sodium bicarb for acute hyperkalemia management while awaiting transfer and dialysis.    PROGRESS NOTE:  11:17 AM  Spoke with Rada Hay, DO at [facility], he has agreed to accept the pt in an ED to ED transfer to [facility].     CRITICAL CARE NOTE :    11:29 AM    IMPENDING DETERIORATION -Airway, Respiratory, Cardiovascular, Metabolic and Renal    ASSOCIATED RISK FACTORS - Dysrhythmia and Metabolic changes    MANAGEMENT- Bedside Assessment, Supervision of Care and Transfer    INTERPRETATION -  Xrays, ECG and Blood Pressure    INTERVENTIONS - hemodynamic mngmt and Metobolic interventions    CASE REVIEW - Medical Sub-Specialist and Nursing    TREATMENT RESPONSE -Improved    PERFORMED BY - Self    NOTES   :  I have spent 60 minutes of critical care time involved in lab review, consultations with specialist, family decision- making, bedside attention and documentation. During this entire length of time I was immediately available to the patient.    Critical Care:  The reason for providing this level of medical care for this critically ill patient was due to a critical illness that impaired one or more vital organ systems such that there was a high probability of imminent or life threatening deterioration in the patients condition. This care involved high complexity decision making to assess, manipulate, and  support vital system functions.    Luther Hearing, MD    1:24 PM  LAST LOOK:  Just prior to transfer, I re-evaluated the patient. Pertinent physical exam includes Now diaphoretic and lethargic. States that he doesn't feel good. States "I don't take insulin, I don't know why you gave it to me". Informed pt that the regimen he got was to shift his potassium. Rechecked POC Glucose and his glucose was 30. Gave 1 amp D50 and 2 orange juices. Pt more alert and answering questions. Will feed and re-check glucose before transport. Will also send EMS with juice and gram crackers if this happens again in transport. Vitals reviewed and patient/family given a chance to ask questions. All agree with the plan to transfer. At this time, I feel that Ricky Kelley is stable for transfer.    Disposition:  TRANSFER NOTE:  11:18 AM  Pt is being transferred to ED at [facility], transfer accepted by Dr. Ysidro Evert.  The reasons for pt's transfer have been discussed with the pt and available family.  They convey agreement and understanding for the need to be transferred as explained to them by Luther Hearing, MD.    PLAN: Transfer to [facility]    Diagnosis     Clinical Impression:   1. Hypertensive urgency    2. ESRD (end stage renal disease) ([place])    3. Other hypervolemia    4. Acute hyperkalemia    5. Uremia        Attestations:    This note is prepared by Lenora Boys acting as Scribe for Luther Hearing, MD    Luther Hearing, MD : The scribe's documentation has been prepared under my direction and personally reviewed by me in its entirety. I confirm that the note above accurately reflects all work, treatment, procedures, and medical decision making performed by me.

## 2017-01-28 NOTE — Consults (Signed)
Consults by Ashley Royalty, MD at 01/28/17 1724                Author: Ashley Royalty, MD  Service: Nephrology  Author Type: Physician       Filed: 01/28/17 1728  Date of Service: 01/28/17 1724  Status: Signed          Editor: Ashley Royalty, MD (Physician)            Consult Orders        1. IP CONSULT TO NEPHROLOGY [[id number]] ordered by Evette Doffing, DO at 01/28/17 1501                                       Consultation Note      NAME: Ricky Kelley    DOB:  23-Dec-1965    MRN:  [id number]       Date/Time:   01/28/2017 5:24 PM      I have been asked to see this patient by Dr. Kizzie Bane  for advice/opinion re: ESRD.       Assessment :    Plan :      ESRD-MWF-[facility]   Hyperkalemia   noncompliance  Plan for urgent HD.  I spoke with [facility] and they say that they should have a nurse available in about an hour.             Subjective:     CHIEF COMPLAINT:  "I missed my dialysis."      HISTORY OF PRESENT ILLNESS:      Ricky Kelley is a 51 y.o.    male who has a history of ESRD who came to the ER because he was "sick."   He can give little pertinent history.  In talking with our acute  HD nurses they report that the patient used to dialyze at [facility] and recently switched to [facility].  He says that something went wrong with the paperwork.  He says that he is groggy but otherwise OK.  No N/V.  No dyspnea.  No  CP.        Past Medical History:        Diagnosis  Date         ?  Chronic kidney disease            dialysis MWF         ?  Diabetes ([facility])           ?  HTN (hypertension)             Past Surgical History:         Procedure  Laterality  Date          ?  COLONOSCOPY  N/A  11/21/2014          COLONOSCOPY performed by Levonne Spiller, MD at [facility] ENDOSCOPY          ?  VASCULAR SURGERY PROCEDURE UNLIST              dialysis shunt l arm 2012          Social History       Substance Use Topics         ?  Smoking status:  Current Every Day Smoker               Packs/day:  0.50         ?  Smokeless tobacco:  Never Used         ?  Alcohol use  No           Family History         Problem  Relation  Age of Onset          ?  Hypertension  Mother       ?  Hypertension  Sister       ?  Heart Attack  Other            ?  Diabetes  Other           No Known Allergies      Prior to Admission medications             Medication  Sig  Start Date  End Date  Taking?  Authorizing Provider            vit Bcomp,C/folic acid/zinc (DIALYVITE 161 WITH ZINC PO)  Take  by mouth.        Phys Other, MD     carvedilol (COREG) 25 mg tablet  Take 25 mg by mouth two (2) times daily (with meals).        Phys Other, MD     omeprazole (PRILOSEC) 40 mg capsule  Take 40 mg by mouth daily.        Phys Other, MD     fosinopril (MONOPRIL) 10 mg tablet  Take 40 mg by mouth daily.        Phys Other, MD     amLODIPine (NORVASC) 10 mg tablet  Take 1 Tab by mouth daily.  01/13/15      Evette Doffing, DO     hydrALAZINE (APRESOLINE) 100 mg tablet  Take 1 Tab by mouth two (2) times a day.  01/13/15      Evette Doffing, DO     sucralfate (CARAFATE) 1 gram tablet  Take 1 Tab by mouth four (4) times daily.  11/21/14      Levonne Spiller, MD     ONETOUCH ULTRA TEST strip    07/25/13      Phys Other, MD     gabapentin (NEURONTIN) 300 mg capsule    05/31/13      Phys Other, MD            RENAGEL 800 mg tablet    05/04/13      Phys Other, MD        REVIEW OF SYSTEMS:         Unable to obtain reliable ROS due to    mental status    sedated     intubated      Total of 12 systems reviewed as follows:   Constitutional: negative fever, negative chills, negative weight loss   Eyes:   negative visual changes   ENT:   negative sore throat, tongue or lip swelling   Respiratory:  negative cough, negative dyspnea   Cards:  negative for chest pain, palpitations, lower extremity edema   GI:   negative for nausea, vomiting, diarrhea, and abdominal pain   GU:  negative for frequency, dysuria   Integument:  negative for rash and pruritus    Heme:  negative for easy bruising and gum/nose bleeding   Musculoskel: negative for myalgias,  back pain and muscle weakness   Neuro:  negative for headaches, dizziness, vertigo   Psych:  negative for feelings of anxiety, depression    Travel?: none  Objective:     VITALS:       Visit Vitals         ?  BP  (!) 184/94     ?  Pulse  (!) 57     ?  Resp  14     ?  Ht  5\' 6"  (1.676 m)     ?  Wt  74.8 kg (165 lb)     ?  SpO2  96%         ?  BMI  26.63 kg/m2        PHYSICAL EXAM:   Gen:  []   WD []   WN  []  cachectic []   thin []   obese []   disheveled              [x]   ill apearing  []    Critical  [x]     Chronic    [x]    No acute distress      HEENT:   []   NC/AT/PERRLA/EOMI     []   pink conjunctivae      []  pale conjunctivae                   PERRL  []   yes  []   no      []   moist mucosa    []   dry mucosa     hearing intact to voice []   yes  []   No                   NECK:   supple []   yes  []   no        masses []  yes  []  No                thyroid  []    non tender  []    tender      RESP:   [x]   CTA bilaterally/no wheezing/rhonchi/rales/crackles     []   rhonchi bilaterally - no dullness  []  wheezing   []  rhonchi   []  crackles      use of accessory muscles []   yes []   no      CARD:   [x]    regular rate and rhythm/No murmurs/rubs/gallops     murmur  []   yes ()  []   no      Rubs  []   yes  [x]   no       Gallops []   yes  []   no     Rate []    regular  []    irregular        carotid bruits  []  Right  []   Left                  LE edema []   yes  [x]   no           JVP  []   yes   []   no      ABD:    [x]   soft/non distended/non tender/+bowel sounds/no HSM     []    Rigid    tenderness []  yes []  no   Liver enlargement  []    yes []    no                 Spleen enlargement  []    yes []    no     distended []    yes []   no      bowel sound  []   hypoactive   []   hyperactive  LYMPH:    Neck   yes   no       Axillae   yes    no      SKIN:   Rashes   yes     no    Ulcers   yes      no                  tight to palpitation     skin turgor   good   poor   decreased                Cyanosis/clubbing   yes   no      NEUR:     cranial nerves II-XII grossly intact          Cranial nerves deficit                    facial droop       slurred speech     aphasic       Strength normal       weakness     LUE      RUE/    LLE      RLE     follows commands    yes   no             PSYCH:   insight  poor  good   Alert and Oriented to    person    place     time                       depressed   anxious   agitated    lethargic   stuporous    sedated       LAB DATA REVIEWED:       Recent Labs            01/28/17    1036     WBC   8.2     HGB   11.2*     HCT   33.4*        PLT   144*          Recent Labs            01/28/17    1036     NA   137     K   6.7*     CL   99     CO2   23     BUN   135*     CREA   20.39*     GLU   79        CA   9.2          Recent Labs            01/28/17    1036     SGOT   14*     ALT   22     AP   89     TBILI   0.7     ALB   4.0        GLOB   4.3*        No results for input(s): INR, PTP, APTT in the last 72 hours.      No lab exists for component: INREXT    No results for input(s): FE, TIBC, PSAT, FERR in the last 72 hours.    No results for input(s): PH, PCO2, PO2 in the last  72 hours.   No results for input(s): CPK, CKMB in the last 72 hours.      No lab exists for component: TROPONINI     Lab Results         Component  Value  Date/Time            Glucose (POC)  138 (H)  01/28/2017 02:17 PM       Glucose (POC)  133 (H)  01/28/2017 01:23 PM       Glucose (POC)  32 (LL)  01/28/2017 01:15 PM       Glucose (POC)  30 (LL)  01/28/2017 01:13 PM            Glucose (POC)  198 (H)  01/13/2015 05:51 PM           Procedures: see electronic medical records for all procedures/Xrays and details which were not copied into this note but were reviewed prior to creation of Plan.     ________________________________________________________________________           ___________________________________________________   Consulting Physician: Ashley Royalty, MD

## 2017-01-29 LAB — EKG, 12 LEAD, INITIAL
Atrial Rate: 77 {beats}/min
Calculated P Axis: 65 degrees
Calculated R Axis: -11 degrees
Calculated T Axis: 107 degrees
Diagnosis: NORMAL
P-R Interval: 154 ms
Q-T Interval: 396 ms
QRS Duration: 88 ms
QTC Calculation (Bezet): 448 ms
Ventricular Rate: 77 {beats}/min

## 2017-01-29 LAB — EKG 12-LEAD
Atrial Rate: 77 {beats}/min
Diagnosis: NORMAL
P Axis: 65 degrees
P-R Interval: 154 ms
Q-T Interval: 396 ms
QRS Duration: 88 ms
QTc Calculation (Bazett): 448 ms
R Axis: -11 degrees
T Axis: 107 degrees
Ventricular Rate: 77 {beats}/min

## 2018-02-21 ENCOUNTER — Encounter: Attending: Psychiatric/Mental Health | Primary: Family Medicine

## 2019-02-08 NOTE — Telephone Encounter (Signed)
Attempted to reach pt to reschedule appointment. No answer, left detailed vm to return call.      If pt's call back plz reschedule as provider won't be in clinic tomorrow. Thank you.

## 2019-02-09 ENCOUNTER — Ambulatory Visit: Payer: MEDICARE | Attending: Family | Primary: Family Medicine

## 2019-11-15 DIAGNOSIS — I10 Essential (primary) hypertension: Secondary | ICD-10-CM | POA: Insufficient documentation

## 2019-11-15 DIAGNOSIS — Y9301 Activity, walking, marching and hiking: Secondary | ICD-10-CM | POA: Diagnosis not present

## 2019-11-15 DIAGNOSIS — S3992XA Unspecified injury of lower back, initial encounter: Secondary | ICD-10-CM | POA: Insufficient documentation

## 2019-11-15 DIAGNOSIS — W010XXA Fall on same level from slipping, tripping and stumbling without subsequent striking against object, initial encounter: Secondary | ICD-10-CM | POA: Insufficient documentation

## 2019-11-15 DIAGNOSIS — Y9289 Other specified places as the place of occurrence of the external cause: Secondary | ICD-10-CM | POA: Diagnosis not present

## 2019-11-15 DIAGNOSIS — M533 Sacrococcygeal disorders, not elsewhere classified: Secondary | ICD-10-CM | POA: Diagnosis present

## 2019-11-15 DIAGNOSIS — Y998 Other external cause status: Secondary | ICD-10-CM | POA: Insufficient documentation

## 2019-11-16 ENCOUNTER — Emergency Department (HOSPITAL_COMMUNITY)
Admission: EM | Admit: 2019-11-16 | Discharge: 2019-11-16 | Disposition: A | Discharge status: Home / Self Care | Payer: BLUE CROSS/BLUE SHIELD | Attending: Emergency Medicine | Admitting: Emergency Medicine

## 2019-11-16 ENCOUNTER — Encounter (HOSPITAL_COMMUNITY): Payer: Self-pay

## 2019-11-16 ENCOUNTER — Other Ambulatory Visit: Payer: Self-pay

## 2019-11-16 ENCOUNTER — Emergency Department (HOSPITAL_COMMUNITY): Payer: BLUE CROSS/BLUE SHIELD

## 2019-11-16 DIAGNOSIS — S3992XA Unspecified injury of lower back, initial encounter: Secondary | ICD-10-CM

## 2019-11-16 DIAGNOSIS — W19XXXA Unspecified fall, initial encounter: Secondary | ICD-10-CM

## 2019-11-16 HISTORY — DX: Post-traumatic stress disorder, unspecified: F43.10

## 2019-11-16 HISTORY — DX: Anxiety disorder, unspecified: F41.9

## 2019-11-16 HISTORY — DX: Essential (primary) hypertension: I10

## 2019-11-16 MED ORDER — LIDOCAINE 5 % EX PTCH: 1.0000 | MEDICATED_PATCH | CUTANEOUS | 0 refills | Status: AC

## 2019-11-16 MED ORDER — METHOCARBAMOL 500 MG PO TABS: 500.0000 mg | ORAL_TABLET | Freq: Two times a day (BID) | ORAL | 0 refills | Status: AC

## 2019-11-16 NOTE — ED Triage Notes (Signed)
Pt missed step walking down stairs and fell on tailbone at approx 10PM. Pt ambulatory in triage. Denies hitting head.

## 2019-11-16 NOTE — Discharge Instructions (Signed)
Take the prescribed medication as directed. °Follow-up with your primary care doctor. °Return to the ED for new or worsening symptoms. °

## 2019-11-16 NOTE — ED Provider Notes (Signed)
[facility]-EMERGENCY DEPT Provider Note   CSN: [id number] Arrival date & time: 11/15/19  2337     History Chief Complaint  Patient presents with  . Tailbone Pain    Chantry Headen is a 54 y.o. male.  The history is provided by the patient and medical records.   54 year old male with history of anxiety, hypertension, PTSD, presenting to the ED following a fall.  States he was walking in the dark and missed a step at the bottom of his deck and fell onto buttocks.  No head injury or LOC.  States now has pain in the low back and into his tailbone.  Denies numbness or weakness of the legs.  No bowel or bladder incontinence.  Reports history of sciatica in the past, unsure if he "flared it up".  No intervention tried prior to arrival.  Past Medical History:  Diagnosis Date  . Anxiety   . Hypertension   . PTSD (post-traumatic stress disorder)     There are no problems to display for this patient.   History reviewed. No pertinent surgical history.     No family history on file.  Social History   Tobacco Use  . Smoking status: Not on file  Substance Use Topics  . Alcohol use: Not on file  . Drug use: Not on file    Home Medications Prior to Admission medications   Not on File    Allergies    Ketorolac tromethamine, Ketorolac tromethamine, Deprodone, Deprodone, Ibuprofen, Ibuprofen, Oxaprozin, Oxaprozin, Sulfur, Carisoprodol, Carisoprodol, Cyclobenzaprine, and Cyclobenzaprine  Review of Systems   Review of Systems  Musculoskeletal: Positive for back pain.  All other systems reviewed and are negative.   Physical Exam Updated Vital Signs BP (!) 133/83 (BP Location: Right Arm)   Pulse 81   Temp 98 F (36.7 C) (Oral)   Resp 16   Ht 6' (1.829 m)   Wt (!) 124.7 kg   SpO2 100%   BMI 37.30 kg/m   Physical Exam Vitals and nursing note reviewed.  Constitutional:      General: He is not in acute distress.    Appearance: He is well-developed.  He is not diaphoretic.  HENT:     Head: Normocephalic and atraumatic.  Eyes:     Conjunctiva/sclera: Conjunctivae normal.     Pupils: Pupils are equal, round, and reactive to light.  Cardiovascular:     Rate and Rhythm: Normal rate and regular rhythm.     Heart sounds: Normal heart sounds.  Pulmonary:     Effort: Pulmonary effort is normal.     Breath sounds: Normal breath sounds. No stridor. No wheezing.  Abdominal:     General: Bowel sounds are normal.     Palpations: Abdomen is soft.     Tenderness: There is no abdominal tenderness. There is no rebound.  Musculoskeletal:        General: Normal range of motion.     Cervical back: Normal and normal range of motion.     Thoracic back: Normal.     Lumbar back: Tenderness present.       Back:     Comments: Tenderness along lower midline lumbar spine and into sacrum; no deformity noted, small bruise along right upper buttock  Skin:    General: Skin is warm and dry.  Neurological:     Mental Status: He is alert and oriented to person, place, and time.     ED Results / Procedures / Treatments  Labs (all labs ordered are listed, but only abnormal results are displayed) Labs Reviewed - No data to display  EKG None  Radiology DG Lumbar Spine Complete  Result Date: 11/16/2019 CLINICAL DATA:  Fall.  Low back and tailbone pain. EXAM: LUMBAR SPINE - COMPLETE 4+ VIEW COMPARISON:  None. FINDINGS: There is transitional lumbosacral anatomy with partial lumbarization of S1. Vertebral alignment is normal. No fracture is identified. There is mild facet arthrosis and at most minimal disc space narrowing at L5-S1. Disc space heights are preserved elsewhere. Mild anterior vertebral spurring is noted from L1-L4. IMPRESSION: No acute osseous abnormality identified. Electronically Signed   By: Sebastian Ache M.D.   On: 11/16/2019 04:45   DG Sacrum/Coccyx  Result Date: 11/16/2019 CLINICAL DATA:  Fall.  Low back and tailbone pain. EXAM: SACRUM AND  COCCYX - 2+ VIEW COMPARISON:  None. FINDINGS: There is focal angulation of the coccyx which may be developmental or possibly posttraumatic. No acute fracture is identified. The SI joints are approximated. IMPRESSION: No acute osseous abnormality identified. Electronically Signed   By: Sebastian Ache M.D.   On: 11/16/2019 04:48    Procedures Procedures (including critical care time)  Medications Ordered in ED Medications - No data to display  ED Course  I have reviewed the triage vital signs and the nursing notes.  Pertinent labs & imaging results that were available during my care of the patient were reviewed by me and considered in my medical decision making (see chart for details).    MDM Rules/Calculators/A&P  54 year old male presenting to the ED after a fall.  States he missed the bottom step while walking around in the dark and fell on his buttocks.  There was no head injury or loss of consciousness.  He reports significant pain along his lower back and into the tailbone.  Does have a small bruise to the right upper buttocks but no midline step-off or deformity.  Some tenderness noted along lower lumbar spine and sacral region.  Remains neurologically intact, no red flag symptoms suggestive of cauda equina.  X-rays are negative.  Plan to discharge home with symptomatic care.  PDMP reviewed, patient gets regularly scheduled narcotics from PCP along with scattered small prescriptions from other prescribers, most recent was 11/12/19.  States he cannot tolerate NSAIDs so will start muscle relaxer and lidoderm patches, can continue his regular pain medications.  Follow-up with PCP.  Return here for any new/acute changes.  Final Clinical Impression(s) / ED Diagnoses Final diagnoses:  Fall, initial encounter  Tailbone injury, initial encounter    Rx / DC Orders ED Discharge Orders         Ordered    methocarbamol (ROBAXIN) 500 MG tablet  2 times daily     Discontinue  Reprint     11/16/19  0601    lidocaine (LIDODERM) 5 %  Every 24 hours     Discontinue  Reprint     11/16/19 0601           Garlon Hatchet, PA-C 11/16/19 0606    Palumbo, April, MD 11/16/19 [phone]

## 2020-03-05 ENCOUNTER — Inpatient Hospital Stay: Payer: MEDICARE

## 2020-03-05 LAB — METABOLIC PANEL, BASIC
Anion gap: 15 mmol/L (ref 5–15)
BUN/Creatinine ratio: 6 — ABNORMAL LOW (ref 12–20)
BUN: 87 MG/DL — ABNORMAL HIGH (ref 6–20)
CO2: 25 mmol/L (ref 21–32)
Calcium: 9.6 MG/DL (ref 8.5–10.1)
Chloride: 96 mmol/L — ABNORMAL LOW (ref 97–108)
Creatinine: 14.4 MG/DL — ABNORMAL HIGH (ref 0.70–1.30)
GFR est AA: 4 mL/min/{1.73_m2} — ABNORMAL LOW (ref >60)
GFR est non-AA: 4 mL/min/{1.73_m2} — ABNORMAL LOW (ref >60)
Glucose: 120 mg/dL — ABNORMAL HIGH (ref 65–100)
Potassium: 4.6 mmol/L (ref 3.5–5.1)
Sodium: 136 mmol/L (ref 136–145)

## 2020-03-05 LAB — CBC W/O DIFF
ABSOLUTE NRBC: 0 10*3/uL (ref 0.00–0.01)
HCT: 33.9 % — ABNORMAL LOW (ref 36.6–50.3)
HGB: 11.2 g/dL — ABNORMAL LOW (ref 12.1–17.0)
MCH: 30 PG (ref 26.0–34.0)
MCHC: 33 g/dL (ref 30.0–36.5)
MCV: 90.9 FL (ref 80.0–99.0)
MPV: 10.3 FL (ref 8.9–12.9)
NRBC: 0 PER 100 WBC
PLATELET: 231 10*3/uL (ref 150–400)
RBC: 3.73 M/uL — ABNORMAL LOW (ref 4.10–5.70)
RDW: 15.9 % — ABNORMAL HIGH (ref 11.5–14.5)
WBC: 8.8 10*3/uL (ref 4.1–11.1)

## 2020-03-05 LAB — MAGNESIUM
Magnesium: 2.8 mg/dL — ABNORMAL HIGH (ref 1.6–2.4)
Magnesium: 2.8 mg/dL — ABNORMAL HIGH (ref 1.6–2.4)

## 2020-03-05 LAB — BASIC METABOLIC PANEL
Anion Gap: 15 mmol/L (ref 5–15)
BUN: 87 MG/DL — ABNORMAL HIGH (ref 6–20)
Bun/Cre Ratio: 6 — ABNORMAL LOW (ref 12–20)
CO2: 25 mmol/L (ref 21–32)
Calcium: 9.6 MG/DL (ref 8.5–10.1)
Chloride: 96 mmol/L — ABNORMAL LOW (ref 97–108)
Creatinine: 14.4 MG/DL — ABNORMAL HIGH (ref 0.70–1.30)
EGFR IF NonAfrican American: 4 mL/min/{1.73_m2} — ABNORMAL LOW (ref >60)
GFR African American: 4 mL/min/{1.73_m2} — ABNORMAL LOW (ref >60)
Glucose: 120 mg/dL — ABNORMAL HIGH (ref 65–100)
Potassium: 4.6 mmol/L (ref 3.5–5.1)
Sodium: 136 mmol/L (ref 136–145)

## 2020-03-05 LAB — CBC
Hematocrit: 33.9 % — ABNORMAL LOW (ref 36.6–50.3)
Hemoglobin: 11.2 g/dL — ABNORMAL LOW (ref 12.1–17.0)
MCH: 30 PG (ref 26.0–34.0)
MCHC: 33 g/dL (ref 30.0–36.5)
MCV: 90.9 FL (ref 80.0–99.0)
MPV: 10.3 FL (ref 8.9–12.9)
NRBC Absolute: 0 10*3/uL (ref 0.00–0.01)
Nucleated RBCs: 0 PER 100 WBC
Platelets: 231 10*3/uL (ref 150–400)
RBC: 3.73 M/uL — ABNORMAL LOW (ref 4.10–5.70)
RDW: 15.9 % — ABNORMAL HIGH (ref 11.5–14.5)
WBC: 8.8 10*3/uL (ref 4.1–11.1)

## 2020-03-05 MED ORDER — HEPARIN (PORCINE) IN NS (PF) 1,000 UNIT/500 ML IV
1000 unit/500 mL | INTRAVENOUS | Status: AC | PRN
Start: 2020-03-05 — End: 2020-03-05
  Administered 2020-03-05 (×2)

## 2020-03-05 MED ORDER — SODIUM CHLORIDE 0.9 % IJ SYRG
INTRAMUSCULAR | Status: DC | PRN
Start: 2020-03-05 — End: 2020-03-05

## 2020-03-05 MED ORDER — SODIUM CHLORIDE 0.9 % IV
Freq: Once | INTRAVENOUS | Status: DC
Start: 2020-03-05 — End: 2020-03-05

## 2020-03-05 MED ORDER — MIDAZOLAM 1 MG/ML IJ SOLN
1 mg/mL | INTRAMUSCULAR | Status: DC | PRN
Start: 2020-03-05 — End: 2020-03-05
  Administered 2020-03-05 (×2): via INTRAVENOUS

## 2020-03-05 MED ORDER — SODIUM CHLORIDE 0.9 % IJ SYRG
Freq: Three times a day (TID) | INTRAMUSCULAR | Status: DC
Start: 2020-03-05 — End: 2020-03-05

## 2020-03-05 MED ORDER — IOPAMIDOL 76 % IV SOLN
76 % | INTRAVENOUS | Status: DC | PRN
Start: 2020-03-05 — End: 2020-03-05
  Administered 2020-03-05: 20:00:00

## 2020-03-05 MED ORDER — FENTANYL CITRATE (PF) 50 MCG/ML IJ SOLN
50 mcg/mL | INTRAMUSCULAR | Status: AC
Start: 2020-03-05 — End: ?

## 2020-03-05 MED ORDER — HYDROCORTISONE SOD SUCCINATE (PF) 100 MG/2 ML SOLUTION FOR INJECTION
100 mg/2 mL | Freq: Once | INTRAMUSCULAR | Status: DC | PRN
Start: 2020-03-05 — End: 2020-03-05

## 2020-03-05 MED ORDER — FENTANYL CITRATE (PF) 50 MCG/ML IJ SOLN
50 mcg/mL | INTRAMUSCULAR | Status: DC | PRN
Start: 2020-03-05 — End: 2020-03-05
  Administered 2020-03-05 (×2): via INTRAVENOUS

## 2020-03-05 MED ORDER — LIDOCAINE HCL 1 % (10 MG/ML) IJ SOLN
10 mg/mL (1 %) | INTRAMUSCULAR | Status: DC | PRN
Start: 2020-03-05 — End: 2020-03-05
  Administered 2020-03-05: 19:00:00 via INTRADERMAL

## 2020-03-05 MED ORDER — LIDOCAINE HCL 1 % (10 MG/ML) IJ SOLN
10 mg/mL (1 %) | INTRAMUSCULAR | Status: AC
Start: 2020-03-05 — End: ?

## 2020-03-05 MED ORDER — IOPAMIDOL 76 % IV SOLN
76 % | INTRAVENOUS | Status: AC
Start: 2020-03-05 — End: ?

## 2020-03-05 MED ORDER — MIDAZOLAM 1 MG/ML IJ SOLN
1 mg/mL | INTRAMUSCULAR | Status: AC
Start: 2020-03-05 — End: ?

## 2020-03-05 MED ORDER — DIPHENHYDRAMINE HCL 50 MG/ML IJ SOLN
50 mg/mL | Freq: Once | INTRAMUSCULAR | Status: DC | PRN
Start: 2020-03-05 — End: 2020-03-05

## 2020-03-05 MED ORDER — HEPARIN (PORCINE) IN NS (PF) 1,000 UNIT/500 ML IV
1000 unit/500 mL | INTRAVENOUS | Status: AC
Start: 2020-03-05 — End: ?

## 2020-03-05 MED FILL — MIDAZOLAM 1 MG/ML IJ SOLN: 1 mg/mL | INTRAMUSCULAR | Qty: 2

## 2020-03-05 MED FILL — ISOVUE-370  76 % INTRAVENOUS SOLUTION: 370 mg iodine /mL (76 %) | INTRAVENOUS | Qty: 200

## 2020-03-05 MED FILL — FENTANYL CITRATE (PF) 50 MCG/ML IJ SOLN: 50 mcg/mL | INTRAMUSCULAR | Qty: 2

## 2020-03-05 MED FILL — BD POSIFLUSH NORMAL SALINE 0.9 % INJECTION SYRINGE: INTRAMUSCULAR | Qty: 40

## 2020-03-05 MED FILL — HEPARIN (PORCINE) IN NS (PF) 1,000 UNIT/500 ML IV: 1000 unit/500 mL | INTRAVENOUS | Qty: 1000

## 2020-03-05 MED FILL — XYLOCAINE 10 MG/ML (1 %) INJECTION SOLUTION: 10 mg/mL (1 %) | INTRAMUSCULAR | Qty: 20

## 2020-03-05 MED FILL — SODIUM CHLORIDE 0.9 % IV: INTRAVENOUS | Qty: 250

## 2020-03-05 NOTE — Procedures (Signed)
Cath:  Normal cors (wrap-around LAD)  Normal LVF (EF 65%).  No AVG/MR  RFA angioseal    F/U with Dr. Merlene Pulling 03/19/20 @ 2pm.

## 2020-03-05 NOTE — H&P (Signed)
Date of Surgery Update:  Ricky Kelley was seen and examined.  History and physical has been reviewed. The patient has been examined. There have been no significant clinical changes since the completion of the originally dated History and Physical.    Signed By: Shelda Altes, MD     March 05, 2020 12:05 PM         Atypical CP  Echo (10/25/19):  EF 60%, mild AS (PG 21, MG 9), mod MR.  Cardiolite (10/25/19):  Normal perfusion, EF 46%.  EBT (02/27/20) = 5449        Maicol, [place] [birth year]   Office/Outpatient Visit  Visit Date: Fri, Feb 29, 2020 1:00 pm  Provider: Merlene Pulling, Orson Slick, MD (Assistant: Omer Jack [place], [place] )  Location: [facility]- [address] [place].[address] [phone]    Electronically signed by Langley Adie, MD on  02/29/2020 01:17:15 PM                           Subjective:    CC: Mr. Abello is a 54 year old Black or African American male.  His primary care physician is Bartholomew Boards, MD..  This is a 1.5 week follow-up visit.  Since his last visit, he has had the following testing: CT.  Patient verbalized medications unchanged since last office visit.  He has a history of abnormal EKG, hypercholesterolemia, and essential hypertension.      HPI:           Hypercholesterolemia:  MD Notes: EBT 02/27/20 - Calcium score of 5449.  Small blebs in the lungs.  Current treatment includes Lipitor and diet.            Regarding hypertension:  MD Notes: He states he had a cardiac catheterization a few years ago in [place].  Type Primary Hypertension Current symptoms include chest pain and shortness of breath. He denies lower extremity edema.  Currently, his treatment regimen consists of daily 81 mg aspirin,  Lipitor,  Procardia XL, and a vasodilator ( hydralazine ).  Prior work-up has included an echocardiogram ( results: 10/25/19 - Normal LV systolic function with an estimated ejection fraction of 60%.  The left ventricular dimension is mildly enlarged There is moderate left  ventricular hypertrophy.  The left atrium is moderately enlarged.  There is moderate mitral regurgitation.  There is moderate tricuspid regurgitation.  with severe pulmonary hypertension.   Mild aortic stenosis with a calculated aortic valve area of 2.2 cm2 with a peak gradient of 21 mmHg and a mean gradient of 9 mmHg. ) and a nuclear imaging study ( results: 10/25/19 - Abnormal myocardial perfusion Tetrofosmin Myoview SPECT imaging showing dilated left ventricle with global hypokinesis.By gated SPECT, post exercise global LVEF was mildly reduced.  LVEF of 46%. ).          MD Notes: Sinus, LVH, and anterolateral T-wave inversion.  Abnormal electrocardiogram [ECG] [EKG] noted.            Regarding dyspnea/shortness of breath: This tends to be worse with exertion.  The shortness of breath is better rest.            Regarding chest pain:   The discomfort is located primarily in the left parasternal region.  Typically, individual episodes of chest pain last only a few seconds.  He characterizes the pain as pressure.  There are no identifiable aggravating factors.  Coronary Artery Disease: Current symptoms include chest pain and shortness of breath.        Abnormal result of other cardiovascular function study noted.  Prior work-up has included Lexiscan Myoview 10/25/19 - Abnormal myocardial perfusion Tetrofosmin Myoview SPECT imaging showing dilated left ventricle with global hypokinesis.By gated SPECT, post exercise global LVEF was mildly reduced.  LVEF of 46%..    Past Medical History / Family History / Social History:     Last Reviewed on 02/29/2020 01:01 PM by Omer JackMassie, Whitney [place]  Past Medical History:     Hypertension   Chronic renal failure   Spinal Stenosis Pre-Diabetic   INFLUENZA VACCINE: was last done 01/2020   COVID-19 VACCINE: was last done 2021 Pfizer x2     Past Cardiac Procedures/Tests:  Echocardiogram on 10/25/19 - Normal LV systolic function with an estimated ejection fraction of 60%.  The left  ventricular dimension is mildly enlarged There is moderate left ventricular hypertrophy.  The left atrium is moderately enlarged.  There is moderate mitral regurgitation.  There is moderate tricuspid regurgitation.  with severe pulmonary hypertension.   Mild aortic stenosis with a calculated aortic valve area of 2.2 cm2 with a peak gradient of 21 mmHg and a mean gradient of 9 mmHg..  Nuclear Study:  10/25/19 - Abnormal myocardial perfusion Tetrofosmin Myoview SPECT imaging showing dilated left ventricle with global hypokinesis.By gated SPECT, post exercise global LVEF was mildly reduced.  LVEF of 46%.  No significant ischemia.      Surgical History:   Surgical/Procedural History: NONE     Family History:   Father: Medical history unknown   Mother: breast cancer     Social History:   Social History:   Occupation: Disabled   Marital Status: Married   Children: 6     Tobacco/Alcohol/Supplements:   Last Reviewed on 02/29/2020 01:01 PM by Omer JackMassie, Whitney NE  TOBACCO/ALCOHOL/SUPPLEMENTS   Tobacco: He has never smoked.    Alcohol: Non-drinker     Substance Abuse History:   Last Reviewed on 02/29/2020 01:01 PM by Omer JackMassie, Whitney [place]  Substance Use/Abuse:   None     Mental Health History:   Last Reviewed on 02/29/2020 01:01 PM by Omer JackMassie, Whitney NE    Communicable Diseases (eg STDs):   Last Reviewed on 02/29/2020 01:01 PM by Omer JackMassie, Whitney NE    Current Problems:   Last Reviewed on 02/29/2020 01:01 PM by Omer JackMassie, Whitney NE  Encounter for preprocedural cardiovascular examination  Pure hypercholesterolemia  Pure hypercholesterolemia, unspecified  Abnormal electrocardiogram [ECG] [EKG]  Essential (primary) hypertension  Cardiac murmur, unspecified  Hypercholesterolemia  Essential hypertension, benign  Abnormal EKG  Preoperative cardiovascular examination  Pure hyperglyceridemia  Cardiac murmur  Pure hypercholesterolemia  Dyspnea, unspecified  Other chest pain  Chest pain, unspecified    Allergies:   Last Reviewed on 02/29/2020  01:01 PM by Omer JackMassie, Whitney NE  No Known Allergies.    Current Medications:   Last Reviewed on 02/29/2020 01:01 PM by Omer JackMassie, Whitney NE  hydrALAZINE 100 mg oral tablet [take 1 tablet (100 mg) by oral route 3 times per day with food]  NIFEdipine 90 mg oral Tablet, Extended Release 24 hr [take 1 tablet (90 mg) by oral route once daily]  aspirin 81 mg oral tablet, delayed release (enteric coated) [take 1 tablet (81 mg) by oral route once daily]    Objective:    Vitals:     Historical:   02/20/2020  BP:   138/70 mm Hg ( (right arm, ,  sitting, );) 02/20/2020  Wt:   140lbs  Current: 02/29/2020 12:59:17 PM  Ht:  5 ft, 6 in;  Wt: 141 lbs;  BMI: 22.8BP: 120/78 mm Hg (right arm, sitting)    Exams:   GENERAL:  Alert, oriented to person, place and time.    HEENT:  Pinkish palpebral  conjunctivae.  Anicteric sclerae.    Neck: Bilateral carotid bruit   CHEST: Equal expansion.  Clear breath sounds.  No rales, no wheezing.    Heart: Reg rate and rhythm. Grade 2/6 systolic ejection murmur at the aortic area radiating to the neck radiating to the precordium.    ABDOMEN:  Soft.  Normal active bowel sounds.  No tenderness.    EXTREMITIES:  No pitting pedal edema.   Equal pulses bilaterally.  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     Assessment:     E78.00   Pure hypercholesterolemia, unspecified     E78.00   Pure hypercholesterolemia     R01.1   Cardiac murmur, unspecified     I10   Essential (primary) hypertension     I10   Essential (primary) hypertension     R94.31   Abnormal electrocardiogram [ECG] [EKG]     E78.0   Pure hypercholesterolemia     I10   Essential (primary) hypertension     R06.00   Dyspnea, unspecified       R06.89 Other abnormalities of breathingR07.9   Chest pain, unspecified     R07.89   Other chest pain     I25.118   Atherosclerotic heart disease of native coronary artery with other forms of angina pectoris     I25.1   Atherosclerotic heart disease of native coronary artery     R07.89   Other chest pain     R94.39   Abnormal  result of other cardiovascular function study       ORDERS:     Procedures Ordered:     [phone]  Education and train for pt self-mgmt by qualified, nonphysician, ea 30 minutes; individual pt  (Send-Out)          XCATH  Cardiac Cath  (In-House)          RFPULM  Pulmonologist Referral  (Send-Out)          Other Orders:     [id number]  Queried Patient for Tobacco Use  (Send-Out)              Plan:     Atherosclerotic heart disease of native coronary artery with other forms of angina pectoris  1.  Medication list has been reviewed. Continue current medications.      Smoking Status:  Nonsmoker   2.  Advised the patient regarding diet, exercise, and lifestyle modification.    3.  The patient to call the office if there is any change in his cardiac symptoms.    4.  Explained to the patient the importance of controlling his cardiac risk factors.      Testing/Procedures: Cardiac Catheterization  Explained to the patient the indication, procedure, risks, and benefits of cardiac catheterization.  The patient understands  and wishes to proceed with the cath to be performed as an outpatient at [facility] by Dr. Shelda Altes.      Schedule a follow up appointment in 2 weeks.      Referrals:  I am referring Mr. Keena to a pulmonologist ( Dr. Ralene Muskrat ).        The above note  was transcribed by Leonette Monarch and authenticated by Dr. Langley Adie prior to sign off.

## 2022-02-09 IMAGING — CR DG LUMBAR SPINE COMPLETE 4+V
5 series · 5 of 5 positions shown · non-contrast
Comparison: None.

CLINICAL DATA: Fall.  Low back and tailbone pain.

EXAM:
LUMBAR SPINE - COMPLETE 4+ VIEW

[t lumbar spine ap]
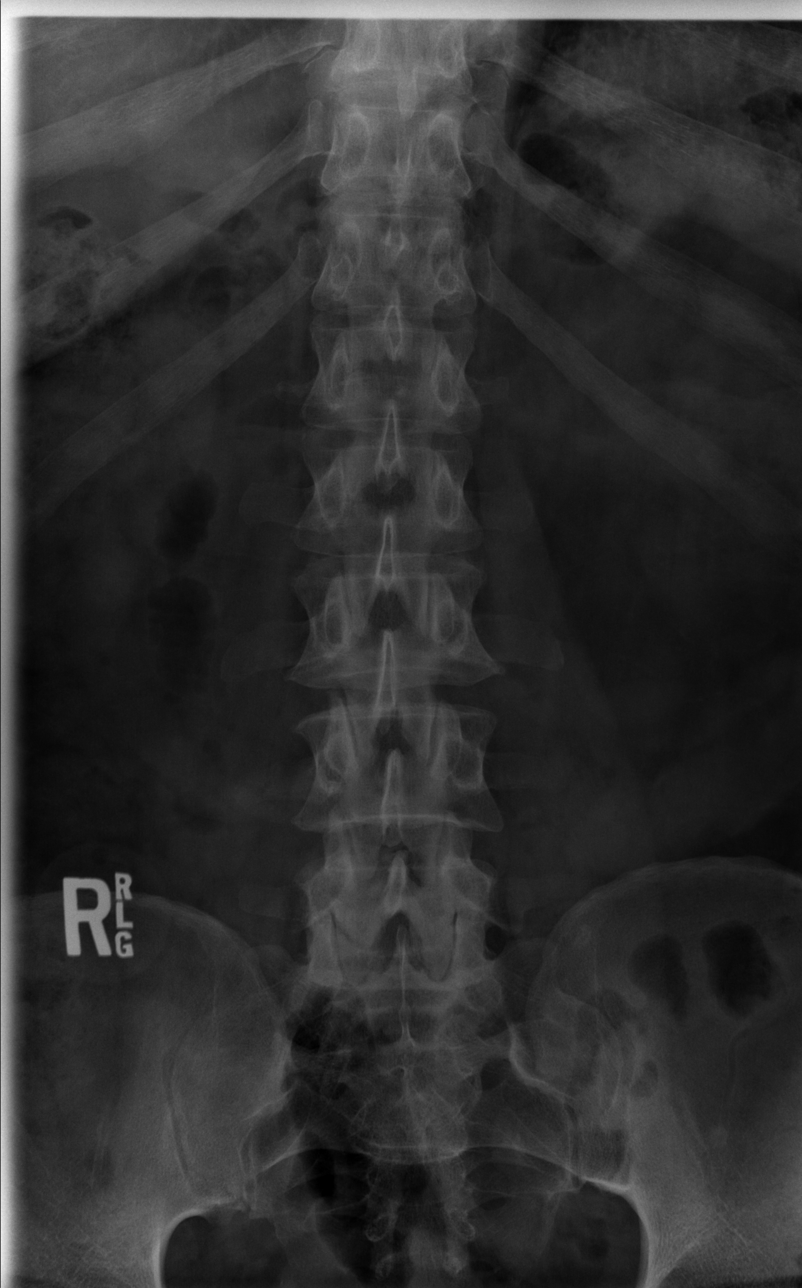

[t lumbar spine obl (1 of 2)]
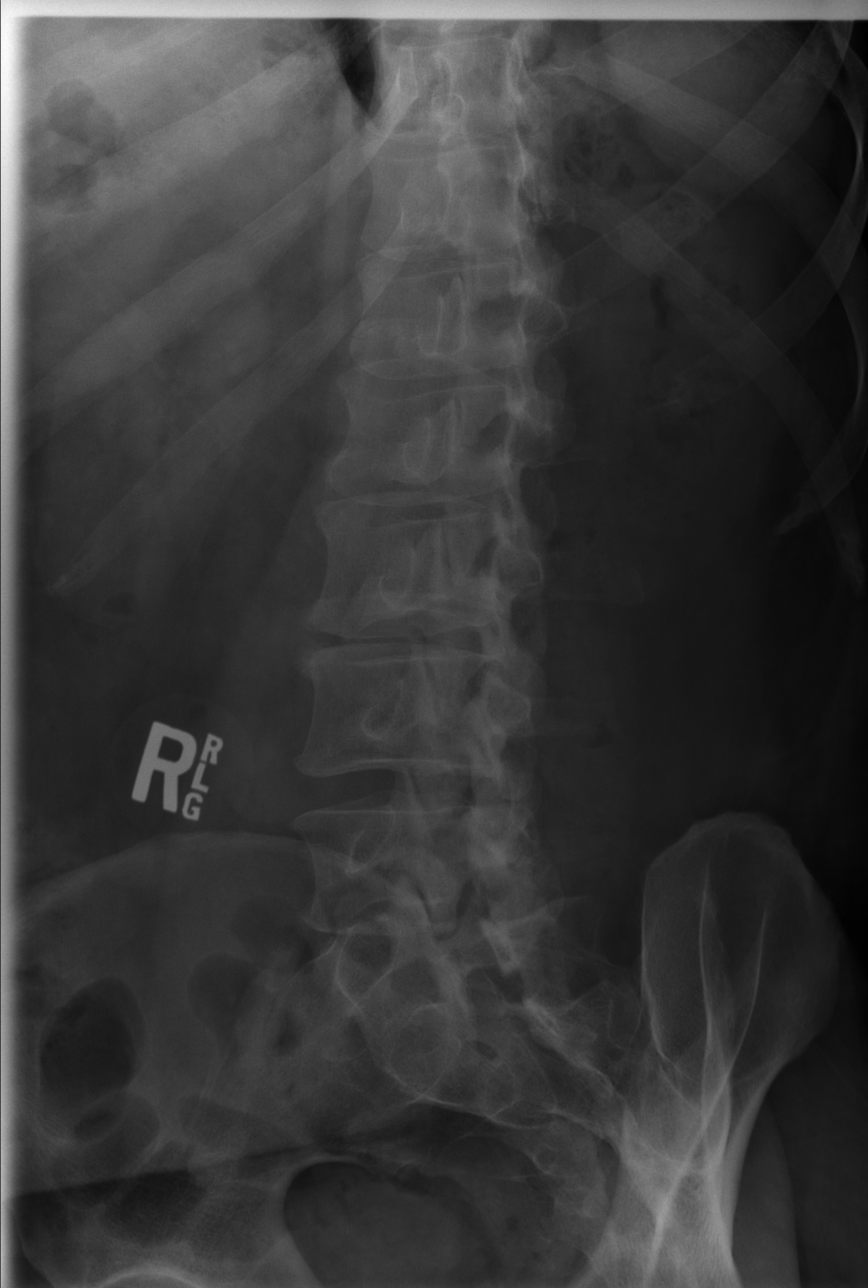

[t lumbar spine obl (2 of 2)]
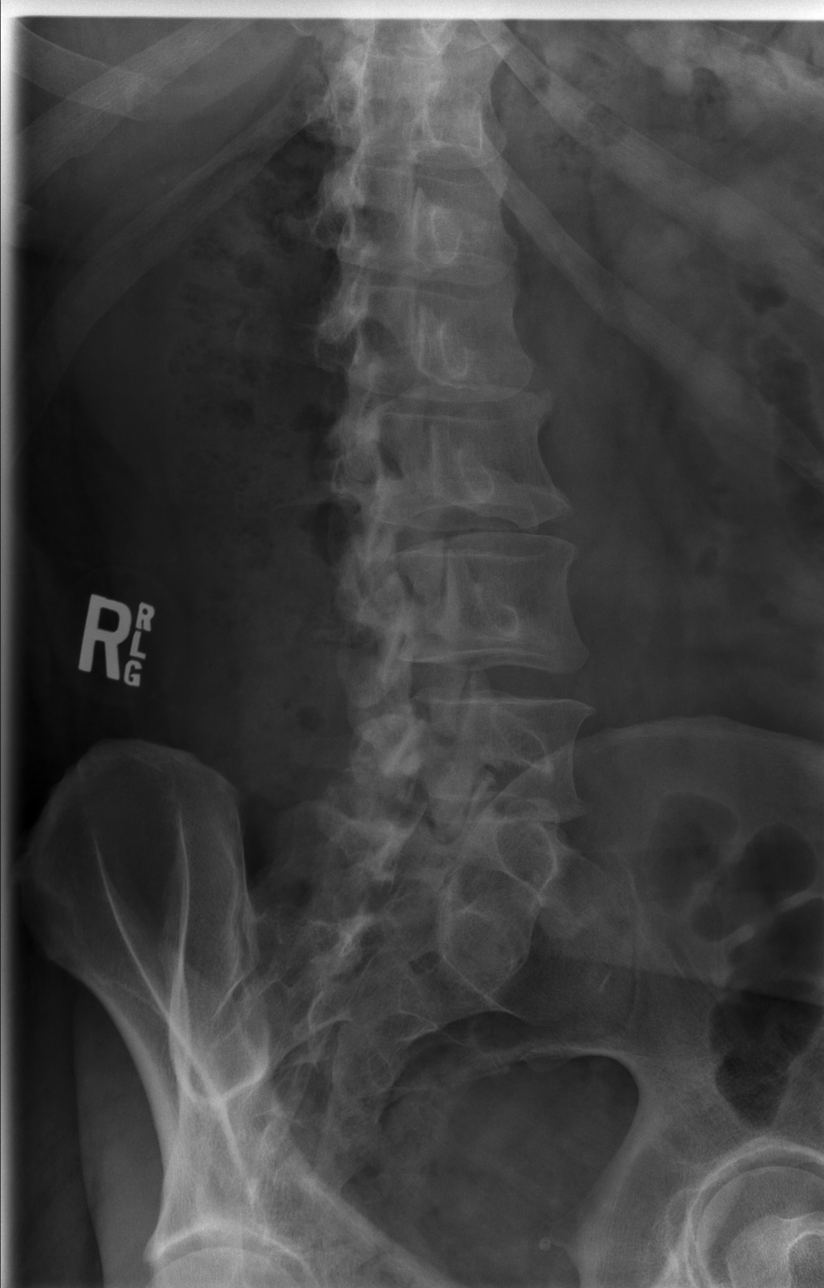

[t lumbar spine lat]
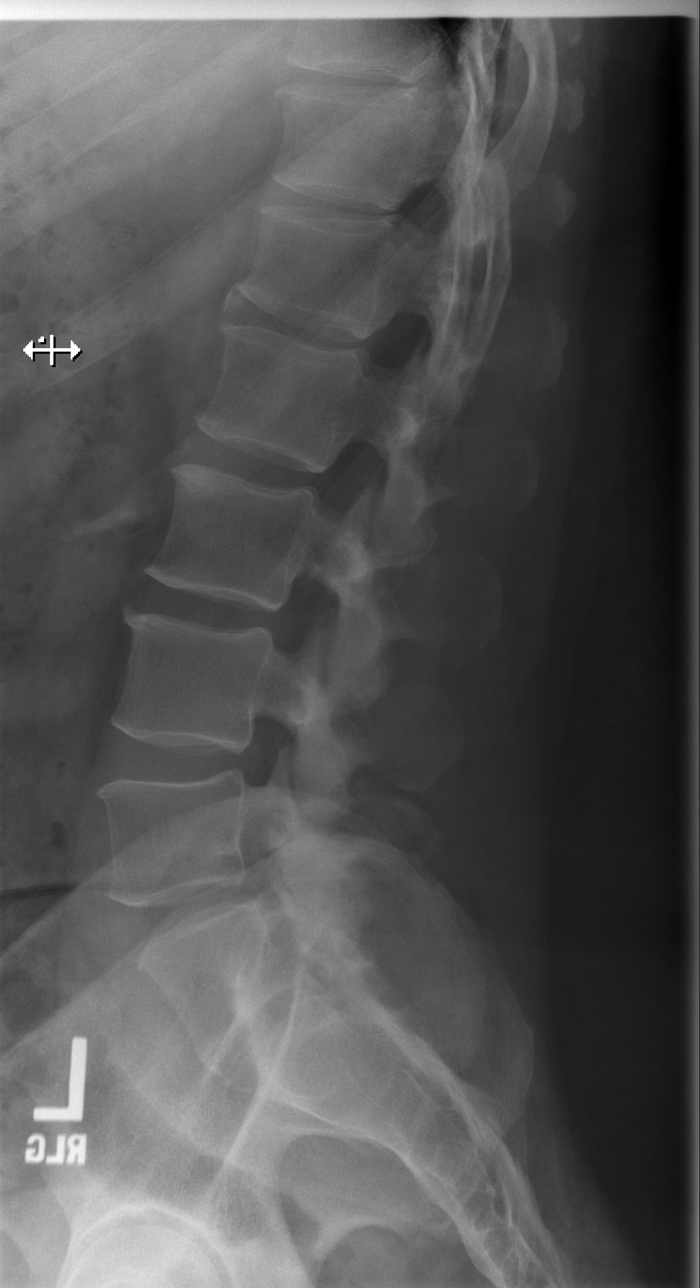

[t lumbar l-5 s-1 spot]
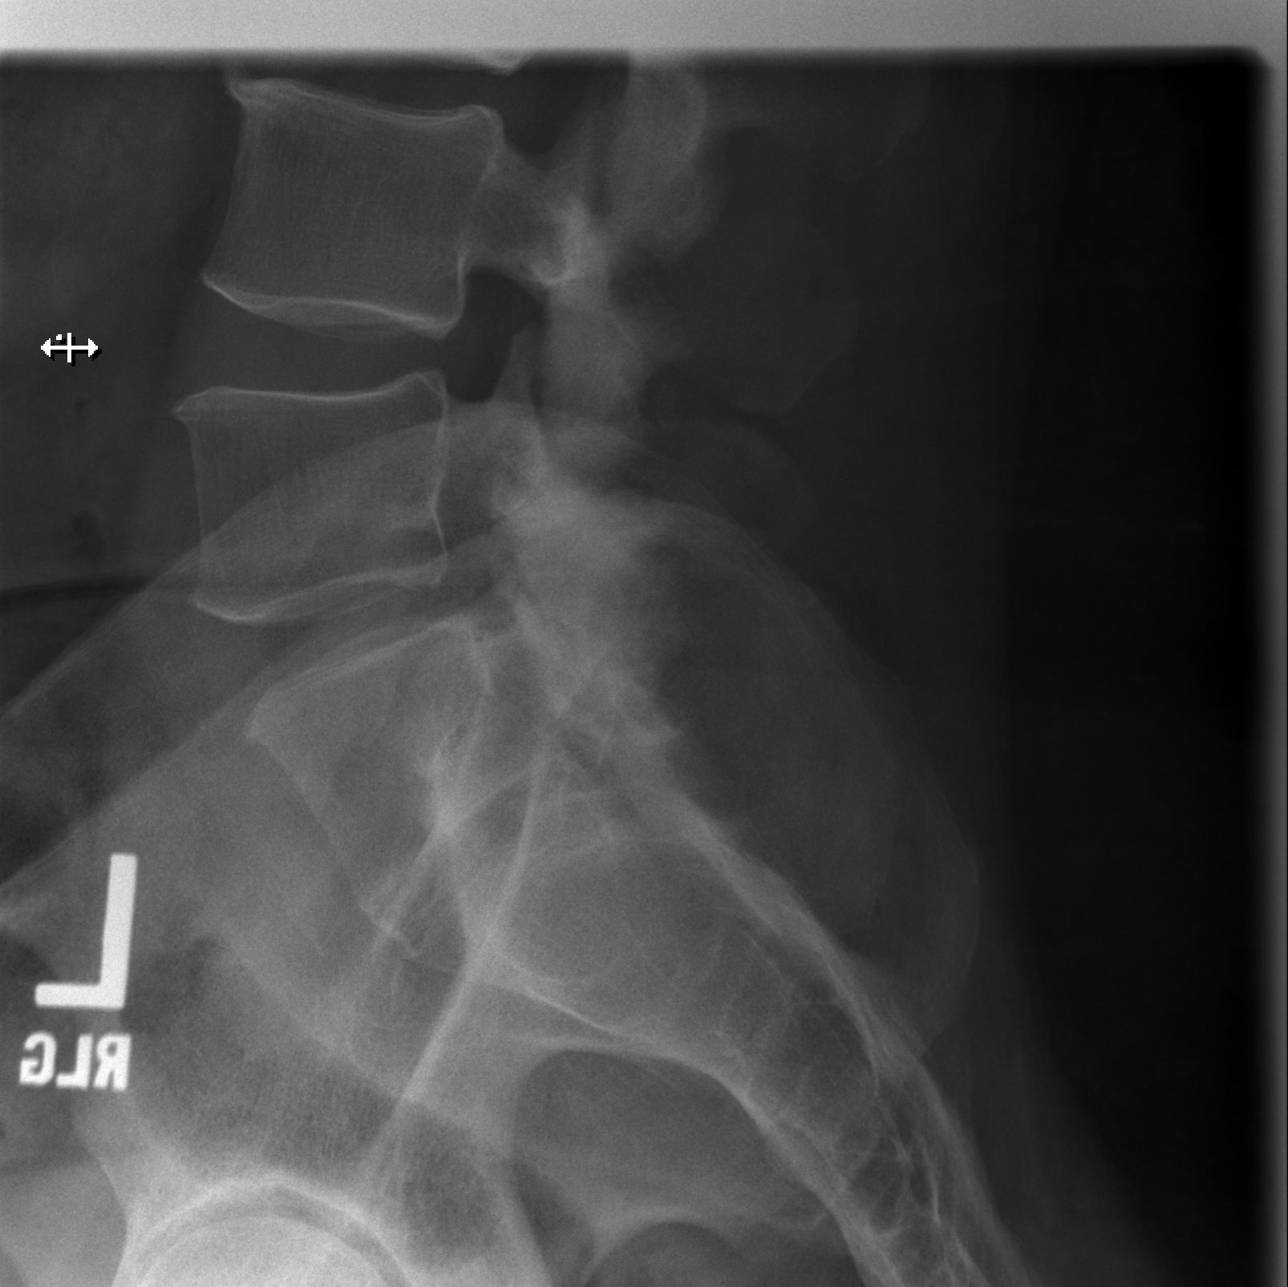

[5 of 5 positions shown; findings below may reference images not displayed]

FINDINGS: There is transitional lumbosacral anatomy with partial lumbarization
of S1. Vertebral alignment is normal. No fracture is identified.
There is mild facet arthrosis and at most minimal disc space
narrowing at L5-S1. Disc space heights are preserved elsewhere. Mild
anterior vertebral spurring is noted from L1-L4.
IMPRESSION: No acute osseous abnormality identified.

## 2022-08-09 DIAGNOSIS — L989 Disorder of the skin and subcutaneous tissue, unspecified: Secondary | ICD-10-CM

## 2022-08-09 DIAGNOSIS — L988 Other specified disorders of the skin and subcutaneous tissue: Secondary | ICD-10-CM

## 2022-08-09 NOTE — ED Notes (Signed)
Emergency Department Nursing Plan of Care    Pt comes in with c/o lesions on skin  2 weeks. Pt says he thinks they are bed bug bites, but they appear to be from skin picking. No bed bugs visualized. Pt has large bag and is homeless.     The Nursing Plan of Care is developed from the Nursing assessment and Emergency Department Attending provider initial evaluation.  The plan of care may be reviewed in the "ED Provider note".    The Plan of Care was developed with the following considerations:  Patient / Family readiness to learn indicated ZO:[id number] understanding  Persons(s) to be included in education: patient  Barriers to Learning/Limitations:None    Signed    Tyriana Helmkamp A Miyajima-Olguin, RN    08/09/2022   10:40 PM

## 2022-08-09 NOTE — ED Notes (Signed)
Discharge instructions were given to the patient by Lamaj Metoyer A Miyajima-Olguin, RN.    The patient left the Emergency Department ambulatory, alert and oriented and in no acute distress with 1 prescription. The patient was encouraged to call or return to the ED for worsening issues or problems and was encouraged to schedule a follow up appointment for continuing care.    The patient verbalized understanding of discharge instructions and prescriptions, all questions were answered. The patient has no further concerns at this time.

## 2022-08-09 NOTE — ED Provider Notes (Signed)
[facility] EMERGENCY DEPT  EMERGENCY DEPARTMENT ENCOUNTER       Pt Name: Ricky Kelley  MRN: [id number]  Birthdate 06-21-1965  Date of evaluation: 08/09/2022  Provider: Ottis Stain, MD   PCP: Bartholomew Boards, DO  Note Started: 10:32 PM EDT 08/09/22     CHIEF COMPLAINT       Chief Complaint   Patient presents with    Insect Bite        HISTORY OF PRESENT ILLNESS: 1 or more elements      History From: patient, EMS, History limited by: none     EQUAN COGBILL is a 57 y.o. male with history of ESRD on Monday/Wednesday/Friday dialysis (scheduled for dialysis tomorrow), diabetes and hypertension presents to the emergency department via EMS with 1 month of skin lesions.    Patient reports he lives in a shelter and "wants to get out of there" and for this he called EMS.  He reports he has not missed any dialysis.  He reports that he has been complaining of bedbugs and they state they have "sprayed it" but he needs help for these lesions.    Patient denies any fevers or chills.  No abdominal pain, nausea, vomiting or diarrhea.    Please See MDM for Additional Details of the HPI/PMH  Nursing Notes were all reviewed and agreed with or any disagreements were addressed in the HPI.     REVIEW OF SYSTEMS        Positives and Pertinent negatives as per HPI.    PAST HISTORY     Past Medical History:  Past Medical History:   Diagnosis Date    Chronic kidney disease     dialysis MWF    Diabetes (HCC)     HTN (hypertension)        Past Surgical History:  Past Surgical History:   Procedure Laterality Date    COLONOSCOPY N/A 11/21/2014    COLONOSCOPY performed by Levonne Spiller, MD at [facility] ENDOSCOPY    VASCULAR SURGERY      dialysis shunt l arm 2012       Family History:  Family History   Problem Relation Age of Onset    Diabetes Other     Hypertension Mother     Hypertension Sister     Heart Attack Other        Social History:  Social History     Tobacco Use    Smoking status: Every Day     Current packs/day: 0.50     Types: Cigarettes     Smokeless tobacco: Never   Substance Use Topics    Alcohol use: No    Drug use: Yes     Frequency: 1.0 times per week     Types: Cocaine       Allergies:  No Known Allergies    CURRENT MEDICATIONS      Previous Medications    ASPIRIN 81 MG CHEWABLE TABLET    Take 1 tablet by mouth daily    ATORVASTATIN (LIPITOR) 10 MG TABLET    Take 1 tablet by mouth daily    CLONIDINE (CATAPRES) 0.2 MG TABLET    Take by mouth 2 times daily    HYDRALAZINE (APRESOLINE) 100 MG TABLET    Take by mouth 2 times daily    NIFEDIPINE (ADALAT CC) 90 MG EXTENDED RELEASE TABLET    Take 1 tablet by mouth daily    PANTOPRAZOLE (PROTONIX) 20 MG TABLET    Take  1 tablet by mouth daily    SEVELAMER (RENVELA) 800 MG TABLET    Take by mouth 3 times daily       SCREENINGS               No data recorded         PHYSICAL EXAM      ED Triage Vitals   Enc Vitals Group      BP       Pulse       Resp       Temp       Temp src       SpO2       Weight       Height       Head Circumference       Peak Flow       Pain Score       Pain Loc       Pain Edu?       Excl. in GC?         Physical Exam  Vitals and nursing note reviewed.   Constitutional:       Comments: 57 year old male, sitting on bench of ambulance, no acute distress   HENT:      Head: Normocephalic.   Pulmonary:      Effort: Pulmonary effort is normal.   Abdominal:      General: Abdomen is flat.   Skin:     Findings: Rash present.      Comments: There are multiple excoriations and circular ulcerations of the patient's upper extremities with healing hyperpigmentation.   Neurological:      General: No focal deficit present.   Psychiatric:         Mood and Affect: Mood normal.          DIAGNOSTIC RESULTS   LABS:     No results found for this or any previous visit (from the past 24 hour(s)).    EKG: If performed, independent interpretation documented below in the MDM section     RADIOLOGY:  Non-plain film images such as CT, Ultrasound and MRI are read by the radiologist. Plain radiographic images are  visualized and preliminarily interpreted by the ED Provider with the findings documented in the MDM section.     Interpretation per the Radiologist below, if available at the time of this note:     No orders to display        PROCEDURES   Unless otherwise noted below, none  Procedures     CRITICAL CARE TIME   0    EMERGENCY DEPARTMENT COURSE and DIFFERENTIAL DIAGNOSIS/MDM   Vitals:    Vitals:    08/09/22 2233   BP: (!) 149/85   Pulse: 84   Resp: 20   Temp: 97.9 F (36.6 C)   TempSrc: Oral   SpO2: 99%   Weight: 59 kg (130 lb)   Height: 1.676 m (5\' 6" )        Patient was given the following medications:  Medications - No data to display    Medical Decision Making  This is a 57 year old male with a history as above who presents with a skin problem for 1 month.  He arrives via EMS.  Due to hospital capacity, and concern for possible bedbug exposure, I evaluated patient in the ambulance bay with nursing. VS obtained upon arrival to the ED were normal.    Patient has reassuring vitals and no acute complaints.  He  has had 1 month of skin lesions which appear most consistent with excoriations which may be in the setting of patient's ESRD, skin picking, arthropod bite or infection.  Primarily it appears as though social determinants of health are at play and patient is having difficulty with his living situation. There is no history or evidence to support cellulitis or abscess given body-wide distribution.    I performed a medical screening exam, and I do not believe patient has an emergent medical condition at this time requiring additional stabilization, work-up or treatment. The patient can be discharged from the ED to follow-up with PCP.  I will provide calamine lotion for patient's lesions. Shelter resources can be provided if needed.    Amount and/or Complexity of Data Reviewed  Independent Historian: EMS    Risk  OTC drugs.  Diagnosis or treatment significantly limited by social determinants of  health.          CLINICAL MANAGEMENT TOOLS:               FINAL IMPRESSION     1. Skin lesions, generalized          DISPOSITION/PLAN   Deontre Allsup Irby's  results have been reviewed with him.  He has been counseled regarding his diagnosis, treatment, and plan.  He verbally conveys understanding and agreement of the signs, symptoms, diagnosis, treatment and prognosis and additionally agrees to follow up as discussed.  He also agrees with the care-plan and conveys that all of his questions have been answered.  I have also provided discharge instructions for him that include: educational information regarding their diagnosis and treatment, and list of reasons why they would want to return to the ED prior to their follow-up appointment, should his condition change.     CLINICAL IMPRESSION    DISPOSITION Decision To Discharge 08/09/2022 10:30:20 PM    PATIENT REFERRED TO:  Bartholomew Boards, DO  [address]  [place] [address]  [phone]    In 3 days      [facility] EMERGENCY DEPT  [address] [place] [address]  [phone]    If symptoms worsen       DISCHARGE MEDICATIONS:     Medication List        START taking these medications      V-R CALAMINE Lotn  Apply to affected area daily as needed for itching.            ASK your doctor about these medications      aspirin 81 MG chewable tablet     atorvastatin 10 MG tablet  Commonly known as: LIPITOR     cloNIDine 0.2 MG tablet  Commonly known as: CATAPRES     hydrALAZINE 100 MG tablet  Commonly known as: APRESOLINE     NIFEdipine 90 MG extended release tablet  Commonly known as: ADALAT CC     pantoprazole 20 MG tablet  Commonly known as: PROTONIX     sevelamer 800 MG tablet  Commonly known as: RENVELA               Where to Get Your Medications        Information about where to get these medications is not yet available    Ask your nurse or doctor about these medications  V-R CALAMINE Lotn           DISCONTINUED MEDICATIONS:  Current Discharge Medication List  I am the Primary Clinician of Record.   Ottis Stain, MD (electronically signed)    (Please note that parts of this dictation were completed with voice recognition software. Quite often unanticipated grammatical, syntax, homophones, and other interpretive errors are inadvertently transcribed by the computer software. Please disregards these errors. Please excuse any errors that have escaped final proofreading.)         Ottis Stain, MD  08/09/22 2243

## 2022-08-10 ENCOUNTER — Inpatient Hospital Stay: Admit: 2022-08-10 | Discharge: 2022-08-10 | Disposition: A | Discharge status: Home / Self Care | Payer: MEDICARE

## 2022-08-10 MED ORDER — SM CALAMINE EX LOTN
CUTANEOUS | 0 refills | Status: AC
Start: 2022-08-10 — End: ?

## 2022-12-26 DEATH — deceased
